# Patient Record
Sex: Male | Born: 1965 | Race: Black or African American | Hispanic: No | Marital: Married | Smoking: Never smoker
Health system: Southern US, Community
[De-identification: ages and names within clinical notes are randomized; demographics above are authoritative.]

## PROBLEM LIST (undated history)

## (undated) DIAGNOSIS — E785 Hyperlipidemia, unspecified: Secondary | ICD-10-CM

## (undated) HISTORY — PX: STRABISMUS SURGERY: SHX218

## (undated) HISTORY — PX: ELBOW SURGERY: SHX618

---

## 2013-11-23 LAB — LIPID PANEL
CHOLESTEROL: 188 mg/dL (ref 0–200)
HDL: 66 mg/dL (ref 35–70)
LDL Cholesterol: 110 mg/dL
Triglycerides: 60 mg/dL (ref 40–160)

## 2014-01-04 ENCOUNTER — Ambulatory Visit: Payer: Self-pay | Admitting: Family Medicine

## 2014-01-04 LAB — RAPID STREP-A WITH REFLX: Micro Text Report: NEGATIVE

## 2014-01-06 LAB — BETA STREP CULTURE(ARMC)

## 2014-04-09 ENCOUNTER — Ambulatory Visit: Payer: Self-pay | Admitting: Internal Medicine

## 2015-05-02 ENCOUNTER — Encounter: Payer: Self-pay | Admitting: Internal Medicine

## 2015-05-02 DIAGNOSIS — M19019 Primary osteoarthritis, unspecified shoulder: Secondary | ICD-10-CM | POA: Insufficient documentation

## 2015-05-02 DIAGNOSIS — E785 Hyperlipidemia, unspecified: Secondary | ICD-10-CM | POA: Insufficient documentation

## 2015-05-02 DIAGNOSIS — M25549 Pain in joints of unspecified hand: Secondary | ICD-10-CM | POA: Insufficient documentation

## 2015-05-02 DIAGNOSIS — R61 Generalized hyperhidrosis: Secondary | ICD-10-CM | POA: Insufficient documentation

## 2015-08-29 ENCOUNTER — Ambulatory Visit (INDEPENDENT_AMBULATORY_CARE_PROVIDER_SITE_OTHER): Payer: Self-pay | Admitting: Internal Medicine

## 2015-08-29 ENCOUNTER — Encounter: Payer: Self-pay | Admitting: Internal Medicine

## 2015-08-29 VITALS — BP 102/52 | HR 69 | Temp 98.2°F | Ht 71.0 in | Wt 143.6 lb

## 2015-08-29 DIAGNOSIS — J40 Bronchitis, not specified as acute or chronic: Secondary | ICD-10-CM

## 2015-08-29 MED ORDER — AMOXICILLIN-POT CLAVULANATE 875-125 MG PO TABS: 1.0000 | ORAL_TABLET | Freq: Two times a day (BID) | ORAL | Status: DC

## 2015-08-29 NOTE — Progress Notes (Signed)
    Date:  08/29/2015   Name:  Shaun Dodson   DOB:  01/19/1966   MRN:  [id number]   Chief Complaint: Cough Cough This is a new problem. The current episode started in the past 7 days. The problem has been unchanged. The problem occurs hourly. The cough is productive of sputum. Associated symptoms include chills and myalgias. Pertinent negatives include no ear pain, fever or nasal congestion. Shaun Dodson has tried OTC cough suppressant for the symptoms. The treatment provided mild relief.     Review of Systems  Constitutional: Positive for chills. Negative for fever.  HENT: Negative for ear pain.   Respiratory: Positive for cough.   Musculoskeletal: Positive for myalgias.    Patient Active Problem List   Diagnosis Date Noted  . Hyperlipidemia, mild 05/02/2015  . 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 Arthralgia of hand 05/02/2015    Prior to Admission medications   Not on File    Allergies  Allergen Reactions  . Compazine [Prochlorperazine Edisylate]     Past Surgical History  Procedure Laterality Date  . Strabismus surgery    . Elbow surgery      Social History  Substance Use Topics  . Smoking status: Never Smoker   . Smokeless tobacco: None  . Alcohol Use: No     Medication list has been reviewed and updated.   Physical Exam  Constitutional: Shaun Dodson appears well-developed and well-nourished.  HENT:  Right Ear: Tympanic membrane and ear canal normal.  Left Ear: Tympanic membrane and ear canal normal.  Nose: Right sinus exhibits no maxillary sinus tenderness and no frontal sinus tenderness. Left sinus exhibits no maxillary sinus tenderness and no frontal sinus tenderness.  Mouth/Throat: Uvula is midline and oropharynx is clear and moist.  Neck: Normal range of motion.  Cardiovascular: Normal rate, regular rhythm and normal heart sounds.   Pulmonary/Chest: Effort normal. No respiratory distress. Shaun Dodson has decreased breath sounds. Shaun Dodson  has no wheezes.  Musculoskeletal: Shaun Dodson exhibits no edema.  Lymphadenopathy:    Shaun Dodson has no cervical adenopathy.  Neurological: Shaun Dodson is alert.  Nursing note and vitals reviewed.   BP 102/52 mmHg  Pulse 69  Temp(Src) 98.2 F (36.8 C)  Ht 5\' 11"  (1.803 m)  Wt 143 lb 9.6 oz (65.137 kg)  BMI 20.04 kg/m2  SpO2 98%  Assessment and Plan: 1. Bronchitis Continue Dayquil as needed Increase fluid intake Out of work today - amoxicillin-clavulanate (AUGMENTIN) 875-125 MG tablet; Take 1 tablet by mouth 2 (two) times daily.  Dispense: 20 tablet; Refill: 0   Bari EdwardLaura Jaquann Guarisco, MD [facility]  08/29/2015

## 2015-12-06 ENCOUNTER — Encounter: Payer: Self-pay | Admitting: *Deleted

## 2015-12-06 ENCOUNTER — Ambulatory Visit
Admission: EM | Admit: 2015-12-06 | Discharge: 2015-12-06 | Disposition: A | Discharge status: Home / Self Care | Payer: BLUE CROSS/BLUE SHIELD | Attending: Family Medicine | Admitting: Family Medicine

## 2015-12-06 ENCOUNTER — Ambulatory Visit (INDEPENDENT_AMBULATORY_CARE_PROVIDER_SITE_OTHER): Payer: BLUE CROSS/BLUE SHIELD

## 2015-12-06 DIAGNOSIS — S61210A Laceration without foreign body of right index finger without damage to nail, initial encounter: Secondary | ICD-10-CM | POA: Diagnosis not present

## 2015-12-06 MED ORDER — TETANUS-DIPHTH-ACELL PERTUSSIS 5-2.5-18.5 LF-MCG/0.5 IM SUSP
0.5000 mL | Freq: Once | INTRAMUSCULAR | Status: AC
  Administered 2015-12-06: 0.5 mL via INTRAMUSCULAR

## 2015-12-06 MED ORDER — CEPHALEXIN 500 MG PO CAPS: 500.0000 mg | ORAL_CAPSULE | Freq: Four times a day (QID) | ORAL | Status: DC

## 2015-12-06 MED ORDER — LIDOCAINE HCL (PF) 1 % IJ SOLN: 5.0000 mL | Freq: Once | INTRAMUSCULAR | Status: DC

## 2015-12-06 NOTE — ED Provider Notes (Signed)
[facility]  ____________________________________________  Time seen: Approximately 2:03 PM  I have reviewed the triage vital signs and the nursing notes.   HISTORY  Chief Complaint Extremity Laceration   HPI Shaun Dodson is a 50 y.o. male presents with wife at bedside for the complaints of right second finger laceration post injury just prior to arrival. Patient reports that he was at home and working outside. Patient reports that he was using a tree pruning handsaw and accidentally cut his right second finger. Patient states that the saw slid downwards hitting his right second finger. Patient reports mild pain at the area. Reports full range of motion. Denies concerns of foreign bodies. Last tetanus shot was greater than 10 years ago. Denies any other pain or injury. Denies fall, head injury, loss of consciousness. Denies decreased range of motion to right second finger. Denies any other pain or injury.  History reviewed. No pertinent past medical history.  Patient Active Problem List   Diagnosis Date Noted  . Hyperlipidemia, mild 05/02/2015  . 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 Arthralgia of hand 05/02/2015    Past Surgical History  Procedure Laterality Date  . Strabismus surgery    . Elbow surgery      Current Outpatient Rx  Name  Route  Sig  Dispense  Refill  .           Marland Kitchen.             Allergies Compazine  Family History  Problem Relation Age of Onset  . Diabetes Mother   . Hyperlipidemia Mother     Social History Social History  Substance Use Topics  . Smoking status: Never Smoker   . Smokeless tobacco: Never Used  . Alcohol Use: No    Review of Systems Constitutional: No fever/chills Eyes: No visual changes. ENT: No sore throat. Cardiovascular: Denies chest pain. Respiratory: Denies shortness of breath. Gastrointestinal: No abdominal pain.  No nausea, no vomiting.  No diarrhea.  No  constipation. Genitourinary: Negative for dysuria. Musculoskeletal: Negative for back pain. Skin: Negative for rash.As above. Neurological: Negative for headaches, focal weakness or numbness.  10-point ROS otherwise negative.  ____________________________________________   PHYSICAL EXAM:  VITAL SIGNS: ED Triage Vitals  Enc Vitals Group     BP 12/06/15 1112 113/68 mmHg     Pulse Rate 12/06/15 1112 60     Resp 12/06/15 1112 18     Temp 12/06/15 1112 97.9 F (36.6 C)     Temp Source 12/06/15 1112 Oral     SpO2 12/06/15 1112 100 %     Weight 12/06/15 1112 145 lb (65.772 kg)     Height 12/06/15 1112 5\' 11"  (1.803 m)     Head Cir --      Peak Flow --      Pain Score 12/06/15 1118 7     Pain Loc --      Pain Edu? --      Excl. in GC? --     Constitutional: Alert and oriented. Well appearing and in no acute distress. Eyes: Conjunctivae are normal. PERRL. EOMI. Head: Atraumatic.  Ears: Normal external appearance bilaterally.  Nose: No congestion/rhinnorhea.  Mouth/Throat: Mucous membranes are moist.   Neck: No stridor.  No cervical spine tenderness to palpation. Cardiovascular: Normal rate, regular rhythm. Grossly normal heart sounds.  Good peripheral circulation. Respiratory: Normal respiratory effort.  No retractions. Lungs CTAB. No wheezes, rales or rhonchi. Gastrointestinal: Soft and nontender.  Musculoskeletal: No lower or upper extremity tenderness nor edema. See skin below. Neurologic:  Normal speech and language. No gross focal neurologic deficits are appreciated. No gait instability. Skin:  Skin is warm, dry and intact. No rash noted. Except: Right second distal phalanx pad 2.8 cm laceration with jagged irregular edges, no foreign bodies visualized, mild active bleeding, full range of motion to right second finger, right second finger tendon flexion and extension intact as well as sensation intact, no tendon visualized, right hand otherwise nontender and right hand skin  otherwise intact. Bilateral hand grips strong and equal. Psychiatric: Mood and affect are normal. Speech and behavior are normal. ____________________________________________   LABS (all labs ordered are listed, but only abnormal results are displayed)  Labs Reviewed - No data to display  RADIOLOGY  Dg Finger Index Right  12/06/2015  CLINICAL DATA:  Laceration while cutting tree with pain, initial encounter EXAM: RIGHT INDEX FINGER 2+V COMPARISON:  None. FINDINGS: Mild soft tissue irregularity is noted distally consistent with the given clinical history. No acute fracture or acute foreign body is noted. IMPRESSION: Soft tissue irregularity without acute bony abnormality. Electronically Signed   By: Alcide Clever M.D.   On: 12/06/2015 13:31   ____________________________________________   PROCEDURES  Procedure(s) performed: Procedure(s) performed:  Procedure explained and verbal consent obtained. Consent: Verbal consent obtained. Written consent not obtained. Risks and benefits: risks, benefits and alternatives were discussed Patient identity confirmed: verbally with patient and hospital-assigned identification number  Consent given by: patient   Laceration Repair Location:  right second finger. Length:  2.8 Foreign bodies: no foreign bodies Tendon involvement: none Nerve involvement: none Preparation: Patient was prepped and draped in the usual sterile fashion. Anesthesia with 1% Lidocaine 6 mls Irrigation solution: saline and Betadine  Irrigation method: jet lavage Wound margins revised  Amount of cleaning: copious Repaired with 5-0 nylon Number of sutures:  5 Technique: simple interrupted  Approximation: loose Patient tolerate well. Wound well approximated post repair.  Antibiotic ointment and dressing applied.  Wound care instructions provided.  Observe for any signs of infection or other problems.     ____________________________________________   INITIAL IMPRESSION  / ASSESSMENT AND PLAN / ED COURSE  Pertinent labs & imaging results that were available during my care of the patient were reviewed by me and considered in my medical decision making (see chart for details).  Well-appearing patient. No acute distress. Presents for the Right distal finger laceration post mechanical injury prior to arrival. Denies any other pain or injury. X-ray soft tissue swelling irregularity without acute bony abnormality per radiologist of right second finger. Laceration repaired. Patient tolerated well. As with dirty object from yard tool, will treat with oral cephalexin prophylactically. Discussed cleaning keeping clean and dry. Tetanus and immunization updated. Return to urgent care in 7-10 days for suture removal. Discussed return sooner for any redness, drainage, swelling, pain, decreased range of motion, new or worsening concerns.  Discussed follow up with Primary care physician this week. Discussed follow up and return parameters including no resolution or any worsening concerns. Patient verbalized understanding and agreed to plan.   ____________________________________________   FINAL CLINICAL IMPRESSION(S) / ED DIAGNOSES  Final diagnoses:  Laceration of right index finger w/o foreign body w/o damage to nail, initial encounter     Discharge Medication List as of 12/06/2015  1:53 PM    START taking these medications   Details  cephALEXin (KEFLEX) 500 MG capsule Take 1 capsule (500 mg total) by mouth 4 (four)  times daily., Starting 12/06/2015, Until Discontinued, Normal        Note: This dictation was prepared with Dragon dictation along with smaller phrase technology. Any transcriptional errors that result from this process are unintentional.       Renford DillsLindsey Kaylia Winborne, NP 12/06/15 1445

## 2015-12-06 NOTE — Discharge Instructions (Signed)
Take medication as prescribed. Keep clean and dry. Apply topical antibiotic daily.   Return to urgent care in 7-10 days for suture removal.   Follow up with your primary care physician this week as needed. Return to Urgent care for redness, drainage, swelling, pain, new or worsening concerns.    Laceration Care, Adult A laceration is a cut that goes through all of the layers of the skin and into the tissue that is right under the skin. Some lacerations heal on their own. Others need to be closed with stitches (sutures), staples, skin adhesive strips, or skin glue. Proper laceration care minimizes the risk of infection and helps the laceration to heal better. HOW TO CARE FOR YOUR LACERATION If sutures or staples were used:  Keep the wound clean and dry.  If you were given a bandage (dressing), you should change it at least one time per day or as told by your health care provider. You should also change it if it becomes wet or dirty.  Keep the wound completely dry for the first 24 hours or as told by your health care provider. After that time, you may shower or bathe. However, make sure that the wound is not soaked in water until after the sutures or staples have been removed.  Clean the wound one time each day or as told by your health care provider:  Wash the wound with soap and water.  Rinse the wound with water to remove all soap.  Pat the wound dry with a clean towel. Do not rub the wound.  After cleaning the wound, apply a thin layer of antibiotic ointmentas told by your health care provider. This will help to prevent infection and keep the dressing from sticking to the wound.  Have the sutures or staples removed as told by your health care provider. If skin adhesive strips were used:  Keep the wound clean and dry.  If you were given a bandage (dressing), you should change it at least one time per day or as told by your health care provider. You should also change it if it  becomes dirty or wet.  Do not get the skin adhesive strips wet. You may shower or bathe, but be careful to keep the wound dry.  If the wound gets wet, pat it dry with a clean towel. Do not rub the wound.  Skin adhesive strips fall off on their own. You may trim the strips as the wound heals. Do not remove skin adhesive strips that are still stuck to the wound. They will fall off in time. If skin glue was used:  Try to keep the wound dry, but you may briefly wet it in the shower or bath. Do not soak the wound in water, such as by swimming.  After you have showered or bathed, gently pat the wound dry with a clean towel. Do not rub the wound.  Do not do any activities that will make you sweat heavily until the skin glue has fallen off on its own.  Do not apply liquid, cream, or ointment medicine to the wound while the skin glue is in place. Using those may loosen the film before the wound has healed.  If you were given a bandage (dressing), you should change it at least one time per day or as told by your health care provider. You should also change it if it becomes dirty or wet.  If a dressing is placed over the wound, be careful not to  apply tape directly over the skin glue. Doing that may cause the glue to be pulled off before the wound has healed.  Do not pick at the glue. The skin glue usually remains in place for 5-10 days, then it falls off of the skin. General Instructions  Take over-the-counter and prescription medicines only as told by your health care provider.  If you were prescribed an antibiotic medicine or ointment, take or apply it as told by your doctor. Do not stop using it even if your condition improves.  To help prevent scarring, make sure to cover your wound with sunscreen whenever you are outside after stitches are removed, after adhesive strips are removed, or when glue remains in place and the wound is healed. Make sure to wear a sunscreen of at least 30 SPF.  Do  not scratch or pick at the wound.  Keep all follow-up visits as told by your health care provider. This is important.  Check your wound every day for signs of infection. Watch for:  Redness, swelling, or pain.  Fluid, blood, or pus.  Raise (elevate) the injured area above the level of your heart while you are sitting or lying down, if possible. SEEK MEDICAL CARE IF:  You received a tetanus shot and you have swelling, severe pain, redness, or bleeding at the injection site.  You have a fever.  A wound that was closed breaks open.  You notice a bad smell coming from your wound or your dressing.  You notice something coming out of the wound, such as wood or glass.  Your pain is not controlled with medicine.  You have increased redness, swelling, or pain at the site of your wound.  You have fluid, blood, or pus coming from your wound.  You notice a change in the color of your skin near your wound.  You need to change the dressing frequently due to fluid, blood, or pus draining from the wound.  You develop a new rash.  You develop numbness around the wound. SEEK IMMEDIATE MEDICAL CARE IF:  You develop severe swelling around the wound.  Your pain suddenly increases and is severe.  You develop painful lumps near the wound or on skin that is anywhere on your body.  You have a red streak going away from your wound.  The wound is on your hand or foot and you cannot properly move a finger or toe.  The wound is on your hand or foot and you notice that your fingers or toes look pale or bluish.   This information is not intended to replace advice given to you by your health care provider. Make sure you discuss any questions you have with your health care provider.   Document Released: 05/24/2005 Document Revised: 10/08/2014 Document Reviewed: 05/20/2014 Elsevier Interactive Patient Education [employer]

## 2015-12-06 NOTE — ED Notes (Signed)
Patient lacerated his right index finger with a tree pruning hand saw this am. Patient was not wearing gloves.

## 2016-03-04 ENCOUNTER — Encounter: Payer: Self-pay | Admitting: *Deleted

## 2016-03-04 ENCOUNTER — Ambulatory Visit
Admission: EM | Admit: 2016-03-04 | Discharge: 2016-03-04 | Disposition: A | Discharge status: Home / Self Care | Payer: BLUE CROSS/BLUE SHIELD | Attending: Family Medicine | Admitting: Family Medicine

## 2016-03-04 DIAGNOSIS — R197 Diarrhea, unspecified: Secondary | ICD-10-CM | POA: Diagnosis not present

## 2016-03-04 LAB — C DIFFICILE QUICK SCREEN W PCR REFLEX
C Diff antigen: NEGATIVE
C Diff interpretation: NOT DETECTED
C Diff toxin: NEGATIVE

## 2016-03-04 LAB — COMPREHENSIVE METABOLIC PANEL
ALT: 17 U/L (ref 17–63)
AST: 24 U/L (ref 15–41)
Albumin: 4.9 g/dL (ref 3.5–5.0)
Alkaline Phosphatase: 59 U/L (ref 38–126)
Anion gap: 10 (ref 5–15)
BILIRUBIN TOTAL: 1.1 mg/dL (ref 0.3–1.2)
BUN: 21 mg/dL — AB (ref 6–20)
CHLORIDE: 99 mmol/L — AB (ref 101–111)
CO2: 29 mmol/L (ref 22–32)
CREATININE: 1.1 mg/dL (ref 0.61–1.24)
Calcium: 9.4 mg/dL (ref 8.9–10.3)
GFR calc Af Amer: >60 mL/min (ref >60)
Glucose, Bld: 84 mg/dL (ref 65–99)
Potassium: 3.9 mmol/L (ref 3.5–5.1)
Sodium: 138 mmol/L (ref 135–145)
TOTAL PROTEIN: 8.4 g/dL — AB (ref 6.5–8.1)

## 2016-03-04 LAB — CBC WITH DIFFERENTIAL/PLATELET
BASOS ABS: 0 10*3/uL (ref 0–0.1)
Basophils Relative: 1 %
Eosinophils Absolute: 0.1 10*3/uL (ref 0–0.7)
Eosinophils Relative: 1 %
HEMATOCRIT: 48.9 % (ref 40.0–52.0)
HEMOGLOBIN: 17.1 g/dL (ref 13.0–18.0)
LYMPHS PCT: 18 %
Lymphs Abs: 1.4 10*3/uL (ref 1.0–3.6)
MCH: 30.2 pg (ref 26.0–34.0)
MCHC: 35 g/dL (ref 32.0–36.0)
MCV: 86.4 fL (ref 80.0–100.0)
Monocytes Absolute: 1.3 10*3/uL — ABNORMAL HIGH (ref 0.2–1.0)
Monocytes Relative: 16 %
NEUTROS ABS: 5 10*3/uL (ref 1.4–6.5)
Neutrophils Relative %: 64 %
Platelets: 182 10*3/uL (ref 150–440)
RBC: 5.66 MIL/uL (ref 4.40–5.90)
RDW: 13.5 % (ref 11.5–14.5)
WBC: 7.9 10*3/uL (ref 3.8–10.6)

## 2016-03-04 LAB — LIPASE, BLOOD: LIPASE: 26 U/L (ref 11–51)

## 2016-03-04 MED ORDER — CIPROFLOXACIN HCL 500 MG PO TABS: 500.0000 mg | ORAL_TABLET | Freq: Two times a day (BID) | ORAL | 0 refills | Status: AC

## 2016-03-04 NOTE — Discharge Instructions (Signed)
Take medication as prescribed. Rest. Drink plenty of fluids. Follow BRAT diet. ° °Follow up with your primary care physician this week as needed. Return to Urgent care for new or worsening concerns.  ° °

## 2016-03-04 NOTE — ED Triage Notes (Signed)
Onset of diarrhea last Monday, has been persistent since. This morning at 0430, onset of right lower leg cramping. [place] traveled to the [place] last [place].

## 2016-03-04 NOTE — ED Provider Notes (Signed)
[facility] ____________________________________________  Time seen: Approximately 1:04 PM  I have reviewed the triage vital signs and the nursing notes.   HISTORY  Chief Complaint Diarrhea  HPI Shaun Dodson is a 50 y.o. male presents for complaint of diarrhea. Patient reports diarrhea has been occurring since Monday. Patient reports that this past week from Wednesday to Monday he was in the [place] for [place]. [place], and then ate at [employer] Monday evening and reports [facility] a [facility] he began having diarrhea. Patient reports multiple diarrheal episodes between Monday to Tuesday. Patient reports later tissues of the diarrhea started to improve and the improvement continued into Wednesday day but reports the diarrhea returned Wednesday evening. Reports 4-5 episodes of diarrhea last night into this morning.  Patient reports this is been unresolved with drinking more fluids and Pepto-Bismol. Patient reports that he did have one episode right after taking the Pepto-Bismol that his tongue looked black but denies any since. Patient also reports after taking the Pepto-Bismol yet one episode of bowel movement looking darker in color but denies any blood in toilet or blood in stool. Denies any other changes in stool color. Patient reports he's had some abdominal "queasiness "but declines abdominal pain.  Denies nausea, vomiting, back pain, dysuria, penile or testicular complaints, rash. Denies known trigger. Patient states that the drink bottled water while traveling, the patient reports while he is on vacation he did consume more alcohol than normal. Patient states that they had a private chef while he is on vacation and declines having any abnormal foods or appearance of his or uncooked. Patient states that he initially thought his symptoms onset was from [facility] due to the  timing.  Denies recent sickness. Denies chest pain, shortness of breath, dizziness or weakness. Patient reports continues to drink fluids well. Denies any urinary changes. States last diarrhea was at approximate 6 and this morning. Denies others around him with similar symptoms.   Bari EdwardLaura Berglund, MD PCP   History reviewed. No pertinent past medical history.  Patient Active Problem List   Diagnosis Date Noted  . Hyperlipidemia, mild 05/02/2015  . 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 Arthralgia of hand 05/02/2015    Past Surgical History:  Procedure Laterality Date  . ELBOW SURGERY    . STRABISMUS SURGERY      Current Outpatient Rx  . Order #: [id number] Class: Normal  . Order #: [id number] Class: Normal    No current facility-administered medications for this encounter.   Current Outpatient Prescriptions:  .  cephALEXin (KEFLEX) 500 MG capsule, Take 1 capsule (500 mg total) by mouth 4 (four) times daily., Disp: 20 capsule, Rfl: 0 .  ciprofloxacin (CIPRO) 500 MG tablet, Take 1 tablet (500 mg total) by mouth 2 (two) times daily., Disp: 10 tablet, Rfl: 0  Allergies Compazine [prochlorperazine edisylate]  Family History  Problem Relation Age of Onset  . Diabetes Mother   . Hyperlipidemia Mother     Social History Social History  Substance Use Topics  . Smoking status: Never Smoker  . Smokeless tobacco: Never Used  . Alcohol use 0.0 oz/week    Review of Systems Constitutional: No fever/chills Eyes: No visual changes. ENT: No sore throat. Cardiovascular: Denies chest pain. Respiratory: Denies shortness of breath. Gastrointestinal: No abdominal pain.  No nausea, no vomiting.  No diarrhea.  No constipation. Genitourinary: Negative for dysuria. Musculoskeletal: Negative  for back pain. Skin: Negative for rash. Neurological: Negative for headaches, focal weakness or numbness.  10-point ROS otherwise  negative.  ____________________________________________   PHYSICAL EXAM:  VITAL SIGNS: ED Triage Vitals  Enc Vitals Group     BP --      Pulse --      Resp 03/04/16 1233 16     Temp 03/04/16 1233 98.1 F (36.7 C)     Temp Source 03/04/16 1233 Oral     SpO2 03/04/16 1233 100 %     Weight 03/04/16 1238 145 lb (65.8 kg)     Height 03/04/16 1238 5\' 11"  (1.803 m)     Head Circumference --      Peak Flow --      Pain Score --      Pain Loc --      Pain Edu? --      Excl. in GC? --    Today's Vitals   03/04/16 1233 03/04/16 1238 03/04/16 1433  Resp: 16    Temp: 98.1 F (36.7 C)    TempSrc: Oral    SpO2: 100%    Weight:  145 lb (65.8 kg)   Height:  5\' 11"  (1.803 m)   PainSc:   0-No pain   Orthostatic VS for the past 24 hrs:  BP- Lying Pulse- Lying BP- Sitting Pulse- Sitting BP- Standing at 0 minutes Pulse- Standing at 0 minutes  03/04/16 1235 114/76 60 111/75 79 120/77 95       Constitutional: Alert and oriented. Well appearing and in no acute distress. Eyes: Conjunctivae are normal. PERRL. EOMI. ENT      Head: Normocephalic and atraumatic.      Mouth/Throat: Mucous membranes are moist.Oropharynx non-erythematous. Normal coloration.  Neck: No stridor. Supple without meningismus.  Hematological/Lymphatic/Immunilogical: No cervical lymphadenopathy. Cardiovascular: Normal rate, regular rhythm. Grossly normal heart sounds.  Good peripheral circulation. Respiratory: Normal respiratory effort without tachypnea nor retractions. Breath sounds are clear and equal bilaterally. No wheezes/rales/rhonchi.. Gastrointestinal: Minimal epigastric tenderness. Abdomen otherwise soft and nontender.  No distention. Normal Bowel sounds. No CVA tenderness. Rectal: Patient declined rectal exam. Musculoskeletal:  Nontender with normal range of motion in all extremities. No midline cervical, thoracic or lumbar tenderness to palpation. Neurologic:  Normal speech and language. No gross focal  neurologic deficits are appreciated. Speech is normal. No gait instability.  Skin:  Skin is warm, dry and intact. No rash noted. Psychiatric: Mood and affect are normal. Speech and behavior are normal. Patient exhibits appropriate insight and judgment   ___________________________________________   LABS (all labs ordered are listed, but only abnormal results are displayed)  Labs Reviewed  CBC WITH DIFFERENTIAL/PLATELET - Abnormal; Notable for the following:       Result Value   Monocytes Absolute 1.3 (*)    All other components within normal limits  COMPREHENSIVE METABOLIC PANEL - Abnormal; Notable for the following:    Chloride 99 (*)    BUN 21 (*)    Total Protein 8.4 (*)    All other components within normal limits  GASTROINTESTINAL PANEL BY PCR, STOOL (REPLACES STOOL CULTURE)  C DIFFICILE QUICK SCREEN W PCR REFLEX  LIPASE, BLOOD     PROCEDURES Procedures    INITIAL IMPRESSION / ASSESSMENT AND PLAN / ED COURSE  Pertinent labs & imaging results that were available during my care of the patient were reviewed by me and considered in my medical decision making (see chart for details).  Very well-appearing patient. No acute distress. Presents  for the complaints of diarrhea. Patient has had recent travel to the [place] just prior to symptom onset. Reports drinking bottled water and declines abnormal foods. Denies obvious blood in stool. Patient does report he had one episode of having darker stool 2 days ago as well as the same timeframe that he had a darker colored tongue, suspect that this is related around the Pepto-Bismol taking may have been from Pepto-Bismol. CBC, CMP and lipase reviewed. Discussed results with patient. Recommended to patient to obtain a GI panel and C. difficile by stool tests, however patient was unable to provide sample in urgent care. Lables sent with patient to bring sample back today. As concern for possible infectious process or traveler's  diarrhea, will treat patient with oral Cipro. Encouraged supportive care with over-the-counter medications as well as fluids and BRAT diet. Discussed follow-up and return parameters with patient.Discussed indication, risks and benefits of medications with patient.  Discussed follow up with Primary care physician this week. Discussed follow up and return parameters including no resolution or any worsening concerns. Patient verbalized understanding and agreed to plan.   ____________________________________________   FINAL CLINICAL IMPRESSION(S) / ED DIAGNOSES  Final diagnoses:  Diarrhea, unspecified type     Discharge Medication List as of 03/04/2016  2:29 PM    START taking these medications   Details  ciprofloxacin (CIPRO) 500 MG tablet Take 1 tablet (500 mg total) by mouth 2 (two) times daily., Starting Thu 03/04/2016, Until Tue 03/09/2016, Normal        Note: This dictation was prepared with Dragon dictation along with smaller phrase technology. Any transcriptional errors that result from this process are unintentional.    Clinical Course      Renford Dills, NP 03/04/16 1503    Renford Dills, NP 03/04/16 1504

## 2016-03-05 ENCOUNTER — Telehealth: Payer: Self-pay

## 2016-03-05 LAB — GASTROINTESTINAL PANEL BY PCR, STOOL (REPLACES STOOL CULTURE)
ADENOVIRUS F40/41: NOT DETECTED
ASTROVIRUS: NOT DETECTED
CAMPYLOBACTER SPECIES: DETECTED — AB
CRYPTOSPORIDIUM: NOT DETECTED
CYCLOSPORA CAYETANENSIS: NOT DETECTED
ENTEROAGGREGATIVE E COLI (EAEC): NOT DETECTED
ENTEROPATHOGENIC E COLI (EPEC): DETECTED — AB
ENTEROTOXIGENIC E COLI (ETEC): NOT DETECTED
Entamoeba histolytica: NOT DETECTED
GIARDIA LAMBLIA: NOT DETECTED
Norovirus GI/GII: NOT DETECTED
PLESIMONAS SHIGELLOIDES: NOT DETECTED
ROTAVIRUS A: NOT DETECTED
Salmonella species: NOT DETECTED
Sapovirus (I, II, IV, and V): NOT DETECTED
Shiga like toxin producing E coli (STEC): NOT DETECTED
Shigella/Enteroinvasive E coli (EIEC): NOT DETECTED
VIBRIO SPECIES: NOT DETECTED
Vibrio cholerae: NOT DETECTED
YERSINIA ENTEROCOLITICA: NOT DETECTED

## 2016-03-05 NOTE — Telephone Encounter (Signed)
Patient was informed of lab results from recent visit. Patient was informed that if symptoms persist or worsen to f/u at the ED.

## 2016-10-03 IMAGING — CR DG FINGER INDEX 2+V*R*
3 series · 3 of 3 positions shown · non-contrast
Comparison: None.

CLINICAL DATA: Laceration while cutting tree with pain, initial
encounter

EXAM:
RIGHT INDEX FINGER 2+V

[finger ap]
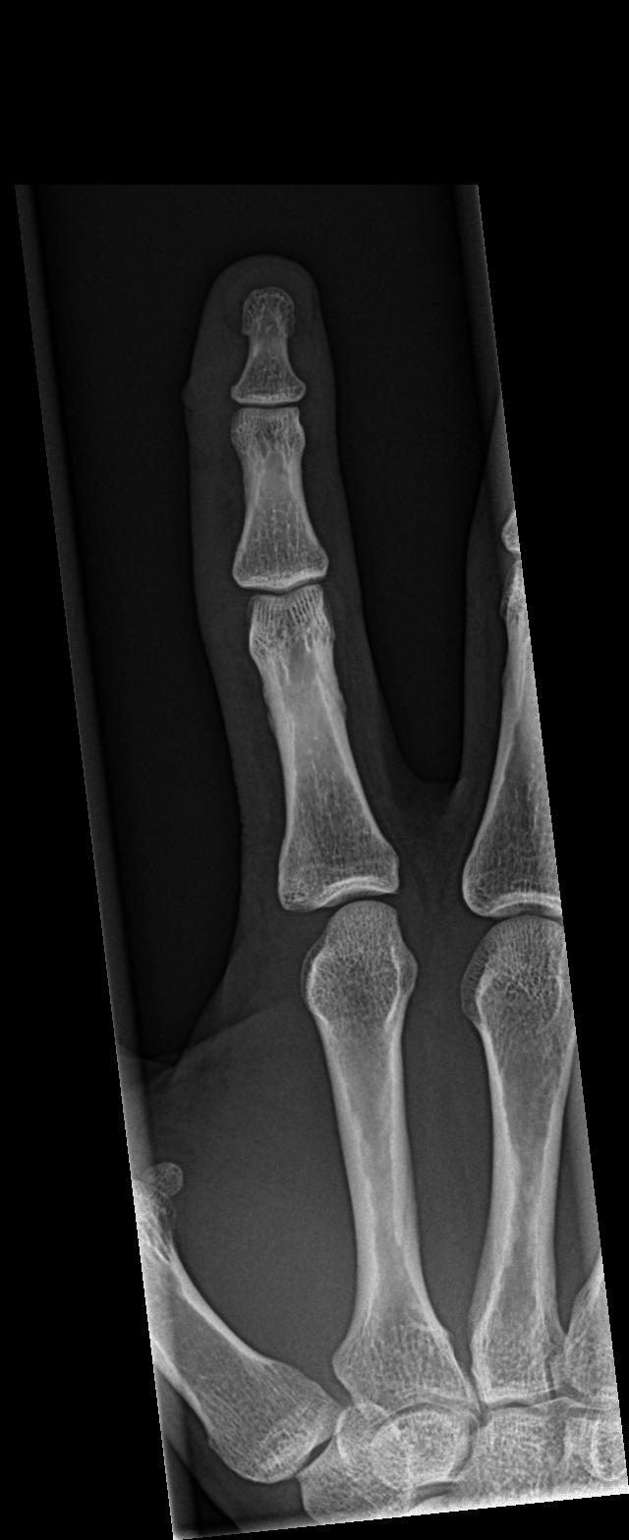

[finger obl]
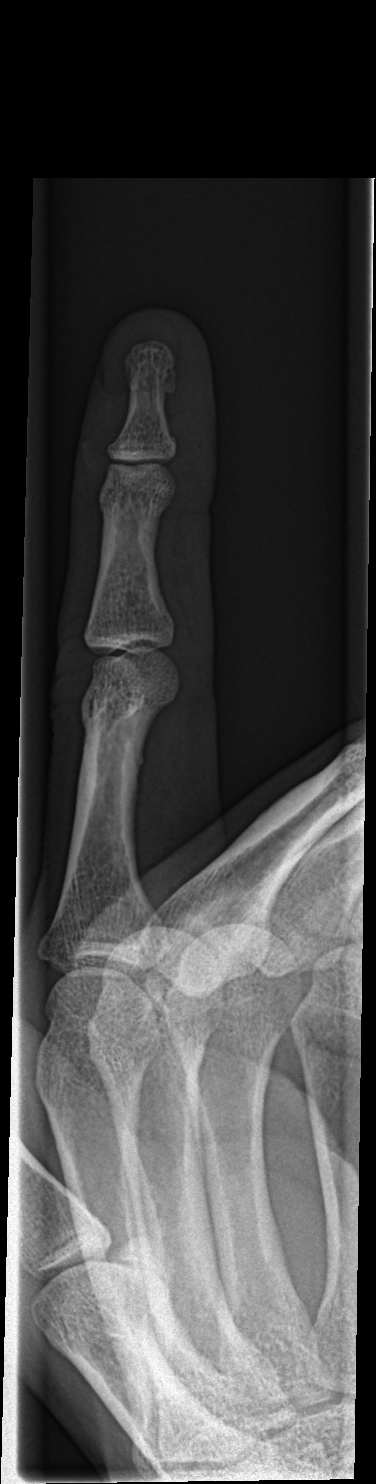

[finger lat]
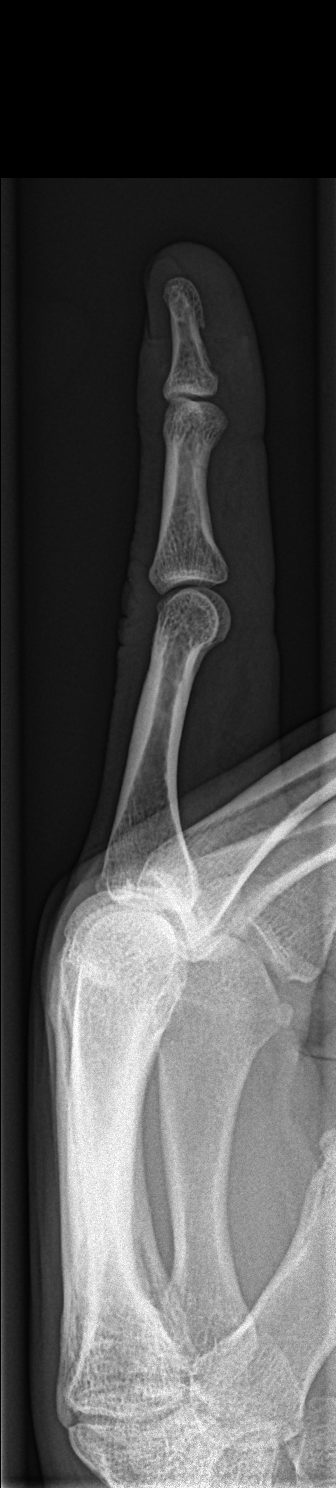

[3 of 3 positions shown; findings below may reference images not displayed]

FINDINGS: Mild soft tissue irregularity is noted distally consistent with the
given clinical history. No acute fracture or acute foreign body is
noted.
IMPRESSION: Soft tissue irregularity without acute bony abnormality.

## 2016-11-10 ENCOUNTER — Ambulatory Visit: Payer: Self-pay | Admitting: Internal Medicine

## 2017-03-29 ENCOUNTER — Ambulatory Visit (INDEPENDENT_AMBULATORY_CARE_PROVIDER_SITE_OTHER): Payer: BLUE CROSS/BLUE SHIELD | Admitting: Internal Medicine

## 2017-03-29 ENCOUNTER — Encounter: Payer: Self-pay | Admitting: Internal Medicine

## 2017-03-29 VITALS — BP 94/68 | HR 62 | Ht 71.0 in | Wt 151.0 lb

## 2017-03-29 DIAGNOSIS — E782 Mixed hyperlipidemia: Secondary | ICD-10-CM | POA: Diagnosis not present

## 2017-03-29 NOTE — Progress Notes (Signed)
    Date:  03/29/2017   Name:  Tamera ReasonKenneth Nelson Dario   DOB:  [birth year]   MRN:  [id number]   Chief Complaint: No chief complaint on file. Hyperlipidemia  This is a chronic problem. Pertinent negatives include no chest pain or shortness of breath. He is currently on no antihyperlipidemic treatment (has taken zocor in the past).   He reports taking Zocor for several years about 8 years ago. He did participate in a study in [place] in which he received an injection of cholesterol lowering medication one time. For several years after that his cholesterol was excellent. He is concerned though that it may have increased again.   Review of Systems  Constitutional: Negative for chills, fatigue and fever.  Eyes: Negative for visual disturbance.  Respiratory: Negative for cough, chest tightness, shortness of breath and wheezing.   Cardiovascular: Negative for chest pain and palpitations.  Gastrointestinal: Negative for abdominal pain, nausea and vomiting.  Musculoskeletal: Negative for arthralgias.  Skin: Negative for rash.  Neurological: Negative for dizziness and headaches.  Hematological: Negative for adenopathy.  Psychiatric/Behavioral: Negative for dysphoric mood and sleep disturbance.    Patient Active Problem List   Diagnosis Date Noted  . Hyperlipidemia, mild 05/02/2015  . 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 Arthralgia of hand 05/02/2015    Prior to Admission medications   Not on File    Allergies  Allergen Reactions  . Compazine [Prochlorperazine Edisylate]     Past Surgical History:  Procedure Laterality Date  . ELBOW SURGERY    . STRABISMUS SURGERY      Social History  Substance Use Topics  . Smoking status: Never Smoker  . Smokeless tobacco: Never Used  . Alcohol use 0.0 oz/week     Medication list has been reviewed and updated.  PHQ 2/9 Scores 03/29/2017  PHQ - 2 Score 0    Physical Exam  Constitutional: He is  oriented to person, place, and time. He appears well-developed. No distress.  HENT:  Head: Normocephalic and atraumatic.  Neck: Carotid bruit is not present.  Cardiovascular: Normal rate, regular rhythm and normal heart sounds.   Pulmonary/Chest: Effort normal and breath sounds normal. No respiratory distress.  Musculoskeletal: Normal range of motion.  Neurological: He is alert and oriented to person, place, and time.  Skin: Skin is warm and dry. No rash noted.  Psychiatric: He has a normal mood and affect. His speech is normal and behavior is normal. Thought content normal.  Nursing note and vitals reviewed.   BP 94/68   Pulse 62   Ht 5\' 11"  (1.803 m)   Wt 151 lb (68.5 kg)   SpO2 100%   BMI 21.06 kg/m   Assessment and Plan: 1. Mixed hyperlipidemia Check labs and advise - Comprehensive metabolic panel - Lipid panel   No orders of the defined types were placed in this encounter.   Partially dictated using Animal nutrition[employer]Dragon software. Any errors are unintentional.  Bari EdwardLaura Branden Shallenberger, MD [facility]  03/29/2017

## 2017-03-30 LAB — LIPID PANEL
Chol/HDL Ratio: 3.7 ratio (ref 0.0–5.0)
Cholesterol, Total: 228 mg/dL — ABNORMAL HIGH (ref 100–199)
HDL: 61 mg/dL (ref >39)
LDL Calculated: 148 mg/dL — ABNORMAL HIGH (ref 0–99)
TRIGLYCERIDES: 95 mg/dL (ref 0–149)
VLDL Cholesterol Cal: 19 mg/dL (ref 5–40)

## 2017-03-30 LAB — COMPREHENSIVE METABOLIC PANEL
A/G RATIO: 1.9 (ref 1.2–2.2)
ALK PHOS: 66 IU/L (ref 39–117)
ALT: 19 IU/L (ref 0–44)
AST: 20 IU/L (ref 0–40)
Albumin: 4.8 g/dL (ref 3.5–5.5)
BUN/Creatinine Ratio: 15 (ref 9–20)
BUN: 14 mg/dL (ref 6–24)
Bilirubin Total: 0.7 mg/dL (ref 0.0–1.2)
CALCIUM: 9.7 mg/dL (ref 8.7–10.2)
CO2: 27 mmol/L (ref 20–29)
Chloride: 99 mmol/L (ref 96–106)
Creatinine, Ser: 0.93 mg/dL (ref 0.76–1.27)
GFR calc Af Amer: 111 mL/min/{1.73_m2} (ref >59)
GFR, EST NON AFRICAN AMERICAN: 96 mL/min/{1.73_m2} (ref >59)
GLOBULIN, TOTAL: 2.5 g/dL (ref 1.5–4.5)
Glucose: 80 mg/dL (ref 65–99)
POTASSIUM: 4.9 mmol/L (ref 3.5–5.2)
SODIUM: 141 mmol/L (ref 134–144)
Total Protein: 7.3 g/dL (ref 6.0–8.5)

## 2017-04-20 ENCOUNTER — Other Ambulatory Visit: Payer: Self-pay

## 2017-04-20 ENCOUNTER — Encounter: Payer: Self-pay | Admitting: *Deleted

## 2017-04-20 ENCOUNTER — Ambulatory Visit
Admission: EM | Admit: 2017-04-20 | Discharge: 2017-04-20 | Disposition: A | Discharge status: Home / Self Care | Payer: BLUE CROSS/BLUE SHIELD | Attending: Family Medicine | Admitting: Family Medicine

## 2017-04-20 DIAGNOSIS — R112 Nausea with vomiting, unspecified: Secondary | ICD-10-CM | POA: Diagnosis not present

## 2017-04-20 LAB — COMPREHENSIVE METABOLIC PANEL
ALK PHOS: 58 U/L (ref 38–126)
ALT: 21 U/L (ref 17–63)
AST: 26 U/L (ref 15–41)
Albumin: 4.3 g/dL (ref 3.5–5.0)
Anion gap: 10 (ref 5–15)
BILIRUBIN TOTAL: 1.3 mg/dL — AB (ref 0.3–1.2)
BUN: 14 mg/dL (ref 6–20)
CALCIUM: 8.6 mg/dL — AB (ref 8.9–10.3)
CO2: 28 mmol/L (ref 22–32)
CREATININE: 0.94 mg/dL (ref 0.61–1.24)
Chloride: 99 mmol/L — ABNORMAL LOW (ref 101–111)
GFR calc non Af Amer: >60 mL/min (ref >60)
Glucose, Bld: 107 mg/dL — ABNORMAL HIGH (ref 65–99)
Potassium: 3.6 mmol/L (ref 3.5–5.1)
SODIUM: 137 mmol/L (ref 135–145)
TOTAL PROTEIN: 7.1 g/dL (ref 6.5–8.1)

## 2017-04-20 MED ORDER — ONDANSETRON 8 MG PO TBDP: 8.0000 mg | ORAL_TABLET | Freq: Three times a day (TID) | ORAL | 0 refills | Status: DC | PRN

## 2017-04-20 NOTE — ED Triage Notes (Signed)
Patient started having symptoms of nausea and vomiting last PM after eating at [employer]. Patient reports that today his nausea has decreased and has been able to drink water and eat chicken noodle soup.

## 2017-04-20 NOTE — ED Provider Notes (Signed)
[facility]    CSN: [id number] Arrival date & time: 04/20/17  1504     History   Chief Complaint Chief Complaint  Patient presents with  . Nausea  . Emesis    HPI Tamera ReasonKenneth Nelson Mathey is a 51 y.o. male.   51 yo male with a c/o nausea and vomiting since last night, hours after eating a sandwich from [employer]. Has vomited at least 5 times and last episode was around 1am. States that today his nausea has subsided and he's been able to drink some water and some Pedialyte. Denies any diarrhea, fevers, chills, hematemesis.    The history is provided by the patient.  Emesis    History reviewed. No pertinent past medical history.  Patient Active Problem List   Diagnosis Date Noted  . Hyperlipidemia, mild 05/02/2015  . 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 Arthralgia of hand 05/02/2015    Past Surgical History:  Procedure Laterality Date  . ELBOW SURGERY    . STRABISMUS SURGERY         Home Medications    Prior to Admission medications   Medication Sig Start Date End Date Taking? Authorizing Provider  ondansetron (ZOFRAN ODT) 8 MG disintegrating tablet Take 1 tablet (8 mg total) every 8 (eight) hours as needed by mouth. 04/20/17   Payton Mccallumonty, Mitch Arquette, MD    Family History Family History  Problem Relation Age of Onset  . Diabetes Mother   . Hyperlipidemia Mother     Social History Social History   Tobacco Use  . Smoking status: Never Smoker  . Smokeless tobacco: Never Used  Substance Use Topics  . Alcohol use: Yes    Alcohol/week: 1.2 oz    Types: 2 Standard drinks or equivalent per week  . Drug use: No     Allergies   Compazine [prochlorperazine edisylate]   Review of Systems Review of Systems  Gastrointestinal: Positive for vomiting.     Physical Exam Triage Vital Signs ED Triage Vitals  Enc Vitals Group     BP 04/20/17 1514 120/73     Pulse Rate 04/20/17 1514 65     Resp 04/20/17 1514 16   Temp 04/20/17 1514 97.8 F (36.6 C)     Temp Source 04/20/17 1514 Oral     SpO2 04/20/17 1514 100 %     Weight 04/20/17 1515 150 lb (68 kg)     Height 04/20/17 1515 5\' 11"  (1.803 m)     Head Circumference --      Peak Flow --      Pain Score 04/20/17 1516 0     Pain Loc --      Pain Edu? --      Excl. in GC? --    No data found.  Updated Vital Signs BP 120/73 (BP Location: Left Arm)   Pulse 65   Temp 97.8 F (36.6 C) (Oral)   Resp 16   Ht 5\' 11"  (1.803 m)   Wt 150 lb (68 kg)   SpO2 100%   BMI 20.92 kg/m   Visual Acuity Right Eye Distance:   Left Eye Distance:   Bilateral Distance:    Right Eye Near:   Left Eye Near:    Bilateral Near:     Physical Exam  Constitutional: He is oriented to person, place, and time. He appears well-developed and well-nourished. No distress.  HENT:  Head: Normocephalic and atraumatic.  Cardiovascular: Normal rate, regular rhythm, normal heart sounds and  intact distal pulses.  No murmur heard. Pulmonary/Chest: Effort normal and breath sounds normal. No respiratory distress. He has no wheezes. He has no rales.  Abdominal: Soft. Bowel sounds are normal. He exhibits no distension and no mass. There is tenderness (mild; diffuse; no rebound or guarding). There is no rebound and no guarding.  Neurological: He is alert and oriented to person, place, and time.  Skin: No rash noted. He is not diaphoretic.  Nursing note and vitals reviewed.    UC Treatments / Results  Labs (all labs ordered are listed, but only abnormal results are displayed) Labs Reviewed  COMPREHENSIVE METABOLIC PANEL - Abnormal; Notable for the following components:      Result Value   Chloride 99 (*)    Glucose, Bld 107 (*)    Calcium 8.6 (*)    Total Bilirubin 1.3 (*)    All other components within normal limits    EKG  EKG Interpretation None       Radiology No results found.  Procedures Procedures (including critical care time)  Medications Ordered in  UC Medications - No data to display   Initial Impression / Assessment and Plan / UC Course  I have reviewed the triage vital signs and the nursing notes.  Pertinent labs & imaging results that were available during my care of the patient were reviewed by me and considered in my medical decision making (see chart for details).       Final Clinical Impressions(s) / UC Diagnoses   Final diagnoses:  Non-intractable vomiting with nausea, unspecified vomiting type   (likely secondary to food poisoning)  ED Discharge Orders        Ordered    ondansetron (ZOFRAN ODT) 8 MG disintegrating tablet  Every 8 hours PRN     04/20/17 1625     1. Lab results and diagnosis reviewed with patient 2. rx as per orders above; reviewed possible side effects, interactions, risks and benefits  3. Recommend supportive treatment with increased fluids 4. Follow-up prn if symptoms worsen or don't improve  Controlled Substance Prescriptions [place] Controlled Substance Registry consulted? Not Applicable   Payton Mccallumonty, Kimberla Driskill, MD 04/20/17 [phone]

## 2017-07-17 ENCOUNTER — Ambulatory Visit
Admission: EM | Admit: 2017-07-17 | Discharge: 2017-07-17 | Disposition: A | Discharge status: Home / Self Care | Payer: BLUE CROSS/BLUE SHIELD | Attending: Family Medicine | Admitting: Family Medicine

## 2017-07-17 ENCOUNTER — Encounter: Payer: Self-pay | Admitting: Gynecology

## 2017-07-17 ENCOUNTER — Other Ambulatory Visit: Payer: Self-pay

## 2017-07-17 DIAGNOSIS — L03012 Cellulitis of left finger: Secondary | ICD-10-CM

## 2017-07-17 MED ORDER — SULFAMETHOXAZOLE-TRIMETHOPRIM 800-160 MG PO TABS: 1.0000 | ORAL_TABLET | Freq: Two times a day (BID) | ORAL | 0 refills | Status: DC

## 2017-07-17 NOTE — ED Triage Notes (Signed)
Patient c/o left ring finger nail infection x 6 days.

## 2017-07-17 NOTE — ED Provider Notes (Signed)
[facility]    CSN: [id number] Arrival date & time: 07/17/17  4098     History   Chief Complaint Chief Complaint  Patient presents with  . Nail Problem    HPI Shaun Dodson is a 52 y.o. male.   52 yo male with a c/o left ring finger redness, swelling and pain of the skin around the edge of the nail. Denies any fevers, chills, drainage. Denies biting his fingernails.    The history is provided by the patient.    History reviewed. No pertinent past medical history.  Patient Active Problem List   Diagnosis Date Noted  . Hyperlipidemia, mild 05/02/2015  . 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 Arthralgia of hand 05/02/2015    Past Surgical History:  Procedure Laterality Date  . ELBOW SURGERY    . STRABISMUS SURGERY         Home Medications    Prior to Admission medications   Medication Sig Start Date End Date Taking? Authorizing Provider  ondansetron (ZOFRAN ODT) 8 MG disintegrating tablet Take 1 tablet (8 mg total) every 8 (eight) hours as needed by mouth. 04/20/17   Payton Mccallum, MD  sulfamethoxazole-trimethoprim (BACTRIM DS,SEPTRA DS) 800-160 MG tablet Take 1 tablet by mouth 2 (two) times daily. 07/17/17   Payton Mccallum, MD    Family History Family History  Problem Relation Age of Onset  . Diabetes Mother   . Hyperlipidemia Mother     Social History Social History   Tobacco Use  . Smoking status: Never Smoker  . Smokeless tobacco: Never Used  Substance Use Topics  . Alcohol use: Yes    Alcohol/week: 1.2 oz    Types: 2 Standard drinks or equivalent per week  . Drug use: No     Allergies   Compazine [prochlorperazine edisylate]   Review of Systems Review of Systems   Physical Exam Triage Vital Signs ED Triage Vitals  Enc Vitals Group     BP 07/17/17 1019 124/73     Pulse Rate 07/17/17 1019 72     Resp 07/17/17 1019 16     Temp 07/17/17 1019 97.9 F (36.6 C)     Temp Source  07/17/17 1019 Oral     SpO2 07/17/17 1019 100 %     Weight 07/17/17 1017 145 lb (65.8 kg)     Height 07/17/17 1016 5\' 11"  (1.803 m)     Head Circumference --      Peak Flow --      Pain Score 07/17/17 1056 2     Pain Loc --      Pain Edu? --      Excl. in GC? --    No data found.  Updated Vital Signs BP 124/73 (BP Location: Left Arm)   Pulse 72   Temp 97.9 F (36.6 C) (Oral)   Resp 16   Ht 5\' 11"  (1.803 m)   Wt 145 lb (65.8 kg)   SpO2 100%   BMI 20.22 kg/m   Visual Acuity Right Eye Distance:   Left Eye Distance:   Bilateral Distance:    Right Eye Near:   Left Eye Near:    Bilateral Near:     Physical Exam  Constitutional: He appears well-developed and well-nourished. No distress.  Musculoskeletal:       Left hand: He exhibits tenderness (left ring finger skin at the edge of the nail) and swelling. He exhibits normal range of motion, no bony tenderness,  normal two-point discrimination, normal capillary refill, no deformity and no laceration. Normal sensation noted. Normal strength noted.  Skin: He is not diaphoretic.  Nursing note and vitals reviewed.    UC Treatments / Results  Labs (all labs ordered are listed, but only abnormal results are displayed) Labs Reviewed - No data to display  EKG  EKG Interpretation None       Radiology No results found.  Procedures Incision and Drainage Date/Time: 07/17/2017 12:25 PM Performed by: Payton Mccallumonty, Chesni Vos, MD Authorized by: Payton Mccallumonty, Vian Fluegel, MD   Consent:    Consent obtained:  Verbal   Consent given by:  Patient   Risks discussed:  Bleeding, incomplete drainage, pain and infection   Alternatives discussed:  No treatment Location:    Type:  Abscess (paronychia)   Location:  Upper extremity   Upper extremity location:  Finger   Finger location:  L ring finger Pre-procedure details:    Skin preparation:  Antiseptic wash Anesthesia (see MAR for exact dosages):    Anesthesia method:  None Procedure type:     Complexity:  Simple Procedure details:    Incision types:  Stab incision (puncture)   Incision depth:  Dermal   Scalpel blade:  11   Drainage:  Purulent   Drainage amount:  Moderate   Wound treatment:  Wound left open   Packing materials:  None Post-procedure details:    Patient tolerance of procedure:  Tolerated well, no immediate complications   (including critical care time)  Medications Ordered in UC Medications - No data to display   Initial Impression / Assessment and Plan / UC Course  I have reviewed the triage vital signs and the nursing notes.  Pertinent labs & imaging results that were available during my care of the patient were reviewed by me and considered in my medical decision making (see chart for details).       Final Clinical Impressions(s) / UC Diagnoses   Final diagnoses:  Paronychia of finger, left    ED Discharge Orders        Ordered    sulfamethoxazole-trimethoprim (BACTRIM DS,SEPTRA DS) 800-160 MG tablet  2 times daily     07/17/17 1053     1. diagnosis reviewed with patient 2. Procedure as per note above 3. rx as per orders above; reviewed possible side effects, interactions, risks and benefits  4. Recommend supportive treatment with warm water soaks 5. Follow-up prn if symptoms worsen or don't improve  Controlled Substance Prescriptions [place] Controlled Substance Registry consulted? Not Applicable   Payton Mccallumonty, Lief Palmatier, MD 07/17/17 1229

## 2017-07-19 ENCOUNTER — Ambulatory Visit
Admission: EM | Admit: 2017-07-19 | Discharge: 2017-07-19 | Disposition: A | Discharge status: Home / Self Care | Payer: BLUE CROSS/BLUE SHIELD | Attending: Family Medicine | Admitting: Family Medicine

## 2017-07-19 ENCOUNTER — Other Ambulatory Visit: Payer: Self-pay

## 2017-07-19 DIAGNOSIS — L03012 Cellulitis of left finger: Secondary | ICD-10-CM

## 2017-07-19 DIAGNOSIS — L02512 Cutaneous abscess of left hand: Secondary | ICD-10-CM

## 2017-07-19 NOTE — Discharge Instructions (Signed)
Continue and finish current antibiotic °

## 2017-07-19 NOTE — ED Triage Notes (Signed)
Patient was seen for paronychia on Sunday and he was placed on antibiotic. Patient states that pain has worsened and pus has started to collect again causing swelling.

## 2017-07-19 NOTE — ED Provider Notes (Signed)
[facility]    CSN: [id number] Arrival date & time: 07/19/17  1024     History   Chief Complaint Chief Complaint  Patient presents with  . Hand Pain    left ring finger    HPI Shaun Dodson is a 52 y.o. male.   HPI  History reviewed. No pertinent past medical history.  Patient Active Problem List   Diagnosis Date Noted  . Hyperlipidemia, mild 05/02/2015  . 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 Arthralgia of hand 05/02/2015    Past Surgical History:  Procedure Laterality Date  . ELBOW SURGERY    . STRABISMUS SURGERY         Home Medications    Prior to Admission medications   Medication Sig Start Date End Date Taking? Authorizing Provider  sulfamethoxazole-trimethoprim (BACTRIM DS,SEPTRA DS) 800-160 MG tablet Take 1 tablet by mouth 2 (two) times daily. 07/17/17  Yes Payton Mccallum, MD  ondansetron (ZOFRAN ODT) 8 MG disintegrating tablet Take 1 tablet (8 mg total) every 8 (eight) hours as needed by mouth. 04/20/17   Payton Mccallum, MD    Family History Family History  Problem Relation Age of Onset  . Diabetes Mother   . Hyperlipidemia Mother     Social History Social History   Tobacco Use  . Smoking status: Never Smoker  . Smokeless tobacco: Never Used  Substance Use Topics  . Alcohol use: Yes    Alcohol/week: 1.2 oz    Types: 2 Standard drinks or equivalent per week  . Drug use: No     Allergies   Compazine [prochlorperazine edisylate]   Review of Systems Review of Systems   Physical Exam Triage Vital Signs ED Triage Vitals  Enc Vitals Group     BP 07/19/17 1057 101/62     Pulse Rate 07/19/17 1057 (!) 52     Resp 07/19/17 1057 18     Temp 07/19/17 1057 97.6 F (36.4 C)     Temp Source 07/19/17 1057 Oral     SpO2 07/19/17 1057 100 %     Weight 07/19/17 1055 145 lb (65.8 kg)     Height 07/19/17 1055 5\' 11"  (1.803 m)     Head Circumference --      Peak Flow --      Pain Score  07/19/17 1055 4     Pain Loc --      Pain Edu? --      Excl. in GC? --    No data found.  Updated Vital Signs BP 101/62 (BP Location: Left Arm)   Pulse (!) 52   Temp 97.6 F (36.4 C) (Oral)   Resp 18   Ht 5\' 11"  (1.803 m)   Wt 145 lb (65.8 kg)   SpO2 100%   BMI 20.22 kg/m   Visual Acuity Right Eye Distance:   Left Eye Distance:   Bilateral Distance:    Right Eye Near:   Left Eye Near:    Bilateral Near:     Physical Exam   UC Treatments / Results  Labs (all labs ordered are listed, but only abnormal results are displayed) Labs Reviewed - No data to display  EKG  EKG Interpretation None       Radiology No results found.  Procedures Incision and Drainage Date/Time: 07/19/2017 2:29 PM Performed by: Payton Mccallum, MD Authorized by: Payton Mccallum, MD   Consent:    Consent obtained:  Verbal   Consent given by:  Patient   Risks discussed:  Bleeding, incomplete drainage, infection and pain   Alternatives discussed:  No treatment Location:    Type:  Abscess ((paronychia))   Location:  Upper extremity   Upper extremity location:  Finger   Finger location:  L ring finger Pre-procedure details:    Skin preparation:  Betadine Anesthesia (see MAR for exact dosages):    Anesthesia method:  None Procedure type:    Complexity:  Simple Procedure details:    Needle aspiration: no     Incision types:  Stab incision   Scalpel blade:  11   Drainage:  Purulent   Drainage amount:  Moderate Post-procedure details:    Patient tolerance of procedure:  Tolerated well, no immediate complications   (including critical care time)  Medications Ordered in UC Medications - No data to display   Initial Impression / Assessment and Plan / UC Course  I have reviewed the triage vital signs and the nursing notes.  Pertinent labs & imaging results that were available during my care of the patient were reviewed by me and considered in my medical decision making (see chart  for details).       Final Clinical Impressions(s) / UC Diagnoses   Final diagnoses:  Paronychia, finger, left    ED Discharge Orders    None     1. diagnosis reviewed with patient 2. Procedure as per note above 3. Continue current antibiotic 4. Recommend supportive treatment with warm compresses to area 5. Follow-up prn if symptoms worsen or don't improve   Controlled Substance Prescriptions [place] Controlled Substance Registry consulted? Not Applicable   Payton Mccallumonty, Donna Snooks, MD 07/19/17 [phone]

## 2017-07-22 ENCOUNTER — Telehealth: Payer: Self-pay | Admitting: Emergency Medicine

## 2017-07-22 NOTE — Telephone Encounter (Signed)
Called to follow up after recent visit. Patient states his finger is much better.

## 2017-10-24 ENCOUNTER — Encounter: Payer: BLUE CROSS/BLUE SHIELD | Admitting: Internal Medicine

## 2018-08-29 ENCOUNTER — Encounter: Payer: Self-pay | Admitting: Internal Medicine

## 2020-03-12 ENCOUNTER — Ambulatory Visit (INDEPENDENT_AMBULATORY_CARE_PROVIDER_SITE_OTHER): Payer: BC Managed Care – PPO

## 2020-03-12 ENCOUNTER — Ambulatory Visit
Admission: EM | Admit: 2020-03-12 | Discharge: 2020-03-12 | Disposition: A | Discharge status: Home / Self Care | Payer: BC Managed Care – PPO | Attending: Family Medicine | Admitting: Family Medicine

## 2020-03-12 ENCOUNTER — Encounter: Payer: Self-pay | Admitting: Emergency Medicine

## 2020-03-12 ENCOUNTER — Other Ambulatory Visit: Payer: Self-pay

## 2020-03-12 DIAGNOSIS — S90121A Contusion of right lesser toe(s) without damage to nail, initial encounter: Secondary | ICD-10-CM

## 2020-03-12 DIAGNOSIS — S92524A Nondisplaced fracture of medial phalanx of right lesser toe(s), initial encounter for closed fracture: Secondary | ICD-10-CM

## 2020-03-12 DIAGNOSIS — W228XXA Striking against or struck by other objects, initial encounter: Secondary | ICD-10-CM

## 2020-03-12 NOTE — ED Triage Notes (Signed)
Pt c/o right 4th toe pain. Pt states he kicked his sofa last night. He has swelling and bruising.

## 2020-03-12 NOTE — Discharge Instructions (Signed)
There is a fracture of the fourth toe.  You can keep this toe buddy taped to the other toe for support.  Try to avoid walking while painful.  Icing and elevate the toe as often as possible.  You can take ibuprofen and Tylenol for pain.  Should heal over the next couple weeks.  If anything worsens follow up with orthopedics  You have a condition requiring you to follow up with Orthopedics so please call one of the following office for appointment:   [facility] [address] [place], [place] [address] Phone: [phone]  [facility] [address] [place], [place] [address] Phone: [phone]

## 2020-03-12 NOTE — ED Provider Notes (Signed)
[facility]    CSN: [id number] Arrival date & time: 03/12/20  0813      History   Chief Complaint Chief Complaint  Patient presents with  . Toe Injury    right 4th toe    HPI Shaun Dodson is a 54 y.o. male presenting for right fourth toe pain.  He states that he accidentally kicked his sofa last night and has subsequent bruising, swelling and pain.  He said his pain had to walk on the foot.  Denies any numbness, tingling, weakness.  Has not iced the foot and has not had to take anything for pain.  Pain is about 8 out of 10 with ambulation.  Denies any previous fractures of this foot.  Denies work-related injury.  Patient otherwise healthy.  No other concerns today.  HPI  History reviewed. No pertinent past medical history.  Patient Active Problem List   Diagnosis Date Noted  . Hyperlipidemia, mild 05/02/2015  . 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 Arthralgia of hand 05/02/2015    Past Surgical History:  Procedure Laterality Date  . ELBOW SURGERY    . STRABISMUS SURGERY         Home Medications    Prior to Admission medications   Medication Sig Start Date End Date Taking? Authorizing Provider  ondansetron (ZOFRAN ODT) 8 MG disintegrating tablet Take 1 tablet (8 mg total) every 8 (eight) hours as needed by mouth. 04/20/17   Payton Mccallum, MD  sulfamethoxazole-trimethoprim (BACTRIM DS,SEPTRA DS) 800-160 MG tablet Take 1 tablet by mouth 2 (two) times daily. 07/17/17   Payton Mccallum, MD    Family History Family History  Problem Relation Age of Onset  . Diabetes Mother   . Hyperlipidemia Mother     Social History Social History   Tobacco Use  . Smoking status: Never Smoker  . Smokeless tobacco: Never Used  Vaping Use  . Vaping Use: Never used  Substance Use Topics  . Alcohol use: Yes    Alcohol/week: 2.0 standard drinks    Types: 2 Standard drinks or equivalent per week  . Drug use: No      Allergies   Compazine [prochlorperazine edisylate]   Review of Systems Review of Systems  Constitutional: Negative for fatigue and fever.  Musculoskeletal: Positive for arthralgias, gait problem and joint swelling.  Skin: Positive for color change. Negative for rash and wound.  Neurological: Negative for weakness and numbness.  Hematological: Does not bruise/bleed easily.     Physical Exam Triage Vital Signs ED Triage Vitals  Enc Vitals Group     BP 03/12/20 0855 131/76     Pulse Rate 03/12/20 0855 63     Resp 03/12/20 0855 18     Temp 03/12/20 0855 98 F (36.7 C)     Temp Source 03/12/20 0855 Oral     SpO2 03/12/20 0855 100 %     Weight 03/12/20 0852 145 lb 1 oz (65.8 kg)     Height 03/12/20 0852 5\' 11"  (1.803 m)     Head Circumference --      Peak Flow --      Pain Score 03/12/20 0851 3     Pain Loc --      Pain Edu? --      Excl. in GC? --    No data found.  Updated Vital Signs BP 131/76 (BP Location: Left Arm)   Pulse 63   Temp 98 F (36.7 C) (Oral)  Resp 18   Ht 5\' 11"  (1.803 m)   Wt 145 lb 1 oz (65.8 kg)   SpO2 100%   BMI 20.23 kg/m       Physical Exam Vitals and nursing note reviewed.  Constitutional:      General: He is not in acute distress.    Appearance: Normal appearance. He is well-developed and normal weight. He is not ill-appearing.  HENT:     Head: Normocephalic and atraumatic.  Eyes:     General: No scleral icterus.    Conjunctiva/sclera: Conjunctivae normal.  Cardiovascular:     Rate and Rhythm: Normal rate and regular rhythm.     Pulses: Normal pulses.  Pulmonary:     Effort: Pulmonary effort is normal.  Musculoskeletal:     Cervical back: Neck supple.     Left foot: Normal range of motion and normal capillary refill. Swelling (entire toe with associated bruising) and bony tenderness (middle phalanx) present. Normal pulse.  Skin:    General: Skin is warm and dry.     Findings: Bruising present.  Neurological:      General: No focal deficit present.     Mental Status: He is alert. Mental status is at baseline.     Motor: No weakness.     Gait: Gait abnormal.  Psychiatric:        Mood and Affect: Mood normal.        Behavior: Behavior normal.        Thought Content: Thought content normal.      UC Treatments / Results  Labs (all labs ordered are listed, but only abnormal results are displayed) Labs Reviewed - No data to display  EKG   Radiology DG Toe 4th Right  Result Date: 03/12/2020 CLINICAL DATA:  Injury last evening, pain, swelling and bruising mostly at PIP joint RIGHT fourth toe EXAM: RIGHT FOURTH TOE COMPARISON:  None FINDINGS: Osseous mineralization normal for technique. Joint spaces preserved. Nondisplaced fracture identified at dorsal aspect base of middle phalanx. No intra-articular extension visualized. No additional fracture, dislocation, or bone destruction. IMPRESSION: Nondisplaced fracture at base of middle phalanx RIGHT fourth toe. Electronically Signed   By: 05/12/2020 M.D.   On: 03/12/2020 09:16    Procedures Procedures (including critical care time)  Medications Ordered in UC Medications - No data to display  Initial Impression / Assessment and Plan / UC Course  I have reviewed the triage vital signs and the nursing notes.  Pertinent labs & imaging results that were available during my care of the patient were reviewed by me and considered in my medical decision making (see chart for details).   Imaging shows fracture of the fourth middle phalanx.  I buddy taped the fourth digit to the third digit.  Advised RICE and NSAID/Tylenol for pain relief.  Follow-up with Ortho if having continued pain or any worsening symptoms over the next couple weeks.  Discussed that it will heal on its own.  Final Clinical Impressions(s) / UC Diagnoses   Final diagnoses:  Contusion of right lesser toe(s) without damage to nail, initial encounter  Closed nondisplaced fracture of middle  phalanx of lesser toe of right foot, initial encounter     Discharge Instructions     There is a fracture of the fourth toe.  You can keep this toe buddy taped to the other toe for support.  Try to avoid walking while painful.  Icing and elevate the toe as often as possible.  You can take  ibuprofen and Tylenol for pain.  Should heal over the next couple weeks.  If anything worsens follow up with orthopedics  You have a condition requiring you to follow up with Orthopedics so please call one of the following office for appointment:   [facility] [address] [place], [place] [address] Phone: [phone]  [facility] [address] [place], [place] [address] Phone: [phone]     ED Prescriptions    None     PDMP not reviewed this encounter.   Shirlee Latch, PA-C 03/12/20 [phone]

## 2020-12-30 ENCOUNTER — Ambulatory Visit
Admission: EM | Admit: 2020-12-30 | Discharge: 2020-12-30 | Disposition: A | Discharge status: Home / Self Care | Payer: BC Managed Care – PPO | Attending: Sports Medicine | Admitting: Sports Medicine

## 2020-12-30 ENCOUNTER — Other Ambulatory Visit: Payer: Self-pay

## 2020-12-30 DIAGNOSIS — S46911A Strain of unspecified muscle, fascia and tendon at shoulder and upper arm level, right arm, initial encounter: Secondary | ICD-10-CM

## 2020-12-30 DIAGNOSIS — M25511 Pain in right shoulder: Secondary | ICD-10-CM

## 2020-12-30 DIAGNOSIS — M7541 Impingement syndrome of right shoulder: Secondary | ICD-10-CM

## 2020-12-30 MED ORDER — ETODOLAC 500 MG PO TABS: 500.0000 mg | ORAL_TABLET | Freq: Two times a day (BID) | ORAL | 0 refills | Status: DC

## 2020-12-30 NOTE — Discharge Instructions (Addendum)
As we discussed, your exam is consistent with a shoulder strain. I prescribed an anti-inflammatory.  No Motrin, Advil, ibuprofen, Naprosyn, Aleve, or aspirin products.  He can take Tylenol if he needs something extra. Educational handouts provided. I provided you the names of 2 orthopedic groups and you can call them for follow-up. If symptoms persist and you cannot get into orthopedics please see your primary care provider. Certainly if symptoms were to worsen in any way please go to the emergency room. Lots of icing and no heat. I provided you a work note keeping up today but you can return to work tomorrow.

## 2020-12-30 NOTE — ED Triage Notes (Signed)
Pt c/o right shoulder pain since last night. Pt reports he picked up a pressure washer last night and thinks he may have strained his shoulder. Pt does have slight decreased ROM in the shoulder. Pt denies any radiating pain.

## 2021-01-01 NOTE — ED Provider Notes (Signed)
[facility]    CSN: [id number] Arrival date & time: 12/30/20  1110      History   Chief Complaint Chief Complaint  Patient presents with   Shoulder Pain    right    HPI Shaun Dodson is a 55 y.o. male.   55 year old male who presents for evaluation of pain in his right shoulder.  He is right-hand dominant.  He reports he was in his garage last evening and he lifted a pressure washer and it kind of slipped and he caught it while he was falling.  He did not hear or feel a pop.  No neck pain numbness or tingling.  He is never any problems with the shoulder in the past.  He has never had any surgery.  He has some discomfort with abduction and external rotation.  Normally sees [facility] in [place] for his ongoing medical care.  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  This is not a work-related injury.  No other issues or problems are offered.  No red flag signs or symptoms elicited on history.   History reviewed. No pertinent past medical history.  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 Authorizing Provider  etodolac (LODINE) 500 MG tablet Take 1 tablet (500 mg total) by mouth 2 (two) times daily. 12/30/20  Yes Delton See, MD  ondansetron (ZOFRAN ODT) 8 MG disintegrating tablet Take 1 tablet (8 mg total) every 8 (eight) hours as needed by mouth. 04/20/17   Payton Mccallum, MD  sulfamethoxazole-trimethoprim (BACTRIM DS,SEPTRA DS) 800-160 MG tablet Take 1 tablet by mouth 2 (two) times daily. 07/17/17   Payton Mccallum, MD    Family History Family History  Problem Relation Age of Onset    Diabetes Mother    Hyperlipidemia Mother     Social History Social History   Tobacco Use   Smoking status: Never   Smokeless tobacco: Never  Vaping Use   Vaping Use: Never used  Substance Use Topics   Alcohol use: Yes    Alcohol/week: 2.0 standard drinks    Types: 2 Standard drinks or equivalent per week   Drug use: No     Allergies   Prochlorperazine edisylate   Review of Systems Review of Systems  Constitutional:  Positive for activity change. Negative for appetite change, chills, diaphoresis, fatigue and fever.  HENT:  Negative for congestion, ear pain, postnasal drip, rhinorrhea, sinus pressure, sinus pain, sneezing and sore throat.   Eyes:  Negative for pain.  Respiratory:  Negative for cough, chest tightness and shortness of breath.   Cardiovascular:  Negative for chest pain and palpitations.  Gastrointestinal:  Negative for abdominal pain, diarrhea, nausea and vomiting.  Genitourinary:  Negative for dysuria.  Musculoskeletal:  Positive for arthralgias. Negative for back pain, myalgias and neck pain.  Skin:  Negative for color change, pallor, rash and wound.  Neurological:  Negative for dizziness, light-headedness, numbness and headaches.  All other systems reviewed and are negative.   Physical Exam Triage Vital Signs ED Triage Vitals  Enc Vitals Group  BP 12/30/20 1302 112/73     Pulse Rate 12/30/20 1302 (!) 58     Resp 12/30/20 1302 18     Temp 12/30/20 1302 97.8 F (36.6 C)     Temp Source 12/30/20 1302 Oral     SpO2 12/30/20 1302 100 %     Weight 12/30/20 1300 155 lb (70.3 kg)     Height 12/30/20 1300 5\' 11"  (1.803 m)     Head Circumference --      Peak Flow --      Pain Score 12/30/20 1259 6     Pain Loc --      Pain Edu? --      Excl. in GC? --    No data found.  Updated Vital Signs BP 112/73 (BP Location: Left Arm)   Pulse (!) 58   Temp 97.8 F (36.6 C) (Oral)   Resp 18   Ht 5\' 11"  (1.803 m)   Wt 70.3 kg   SpO2 100%   BMI 21.62  kg/m   Visual Acuity Right Eye Distance:   Left Eye Distance:   Bilateral Distance:    Right Eye Near:   Left Eye Near:    Bilateral Near:     Physical Exam Vitals and nursing note reviewed.  Constitutional:      General: He is not in acute distress.    Appearance: Normal appearance. He is not ill-appearing, toxic-appearing or diaphoretic.  HENT:     Head: Normocephalic and atraumatic.     Nose: Nose normal.     Mouth/Throat:     Mouth: Mucous membranes are moist.  Eyes:     Extraocular Movements: Extraocular movements intact.     Conjunctiva/sclera: Conjunctivae normal.     Pupils: Pupils are equal, round, and reactive to light.  Cardiovascular:     Rate and Rhythm: Normal rate and regular rhythm.     Pulses: Normal pulses.     Heart sounds: Normal heart sounds. No murmur heard.   No friction rub. No gallop.  Pulmonary:     Effort: Pulmonary effort is normal.     Breath sounds: Normal breath sounds. No stridor. No wheezing, rhonchi or rales.  Musculoskeletal:     Cervical back: Normal range of motion and neck supple.     Comments: Cervical spine: He has full active range of motion.  Strength is well-preserved.  Spurling's test is negative bilaterally.  Left shoulder: Normal to inspection palpation range of motion special test.  Right shoulder: No obvious bony abnormality ecchymosis erythema or soft tissue swelling.  He does have full active range of motion.  A little discomfort with external rotation and abduction.  Has a positive Hawkins test and a positive impingement sign.  There is no evidence of any rotator cuff weakness.  Strength is well-preserved.  Neurovascular: Normal sensation and 2+ pulses.  Skin:    General: Skin is warm and dry.     Capillary Refill: Capillary refill takes less than 2 seconds.  Neurological:     General: No focal deficit present.     Mental Status: He is alert and oriented to person, place, and time.     UC Treatments / Results   Labs (all labs ordered are listed, but only abnormal results are displayed) Labs Reviewed - No data to display  EKG   Radiology No results found.  Procedures Procedures (including critical care time)  Medications Ordered in UC Medications - No data to display  Initial Impression /  Assessment and Plan / UC Course  I have reviewed the triage vital signs and the nursing notes.  Pertinent labs & imaging results that were available during my care of the patient were reviewed by me and considered in my medical decision making (see chart for details).  Clinical impression: 1.  Cute onset of right shoulder pain status post injury 2.  Strain of the right shoulder. 3.  Impingement syndrome of the right shoulder.  Treatment plan: 1.  The findings and treatment plan were discussed in detail with patient.  Patient was in agreement. 2.  I had a long discussion with my findings.  It is consistent with shoulder strain. 3.  I prescribed Lodine for 2 weeks.  Warned him not to take any other anti-inflammatories. 4.  Educational handouts provided. 5.  Provided the names of 2 orthopedic groups and he can follow-up if he still having issues. 6.  Want him to ice and not use heat initially.  He can transition to heat once he gets through the acute phase. 7.  If his symptoms were to worsen then he should go to the ER if he cannot get into orthopedics. 8.  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 9.  He was stable upon discharge and he will follow-up here as needed.    Final Clinical Impressions(s) / UC Diagnoses   Final diagnoses:  Strain of right shoulder, initial encounter  Acute pain of right shoulder  Impingement syndrome of right shoulder     Discharge Instructions      As we discussed, your exam is consistent with a shoulder strain. I prescribed an anti-inflammatory.  No Motrin, Advil, ibuprofen, Naprosyn, Aleve, or aspirin products.   He can take Tylenol if he needs something extra. Educational handouts provided. I provided you the names of 2 orthopedic groups and you can call them for follow-up. If symptoms persist and you cannot get into orthopedics please see your primary care provider. Certainly if symptoms were to worsen in any way please go to the emergency room. Lots of icing and no heat. I provided you a work note keeping up today but you can return to work tomorrow.     ED Prescriptions     Medication Sig Dispense Auth. Provider   etodolac (LODINE) 500 MG tablet Take 1 tablet (500 mg total) by mouth 2 (two) times daily. 30 tablet Delton See, MD      PDMP not reviewed this encounter.   Delton See, MD 01/01/21 [place]

## 2021-01-08 IMAGING — CR DG TOE 4TH 2+V*R*
3 series · 3 of 3 positions shown · non-contrast
Comparison: None

CLINICAL DATA: Injury last evening, pain, swelling and bruising
mostly at PIP joint RIGHT fourth toe

EXAM:
RIGHT FOURTH TOE

[toe ap]
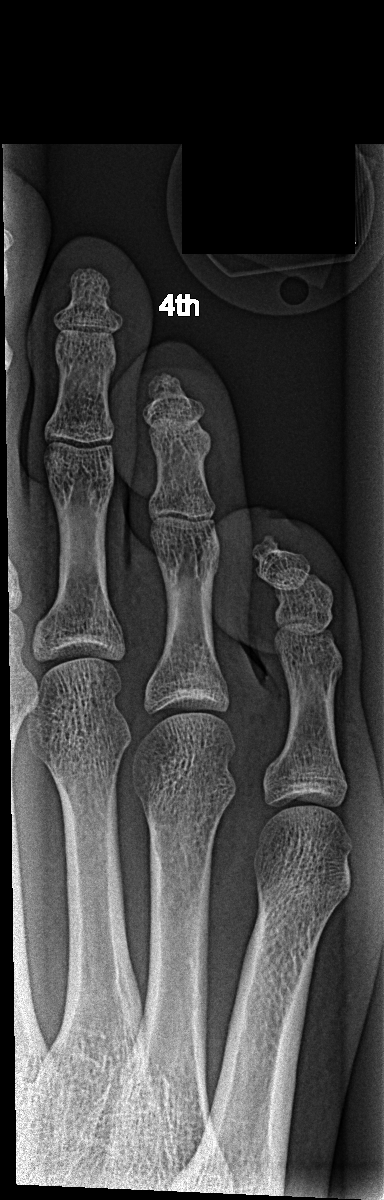

[toe obl]
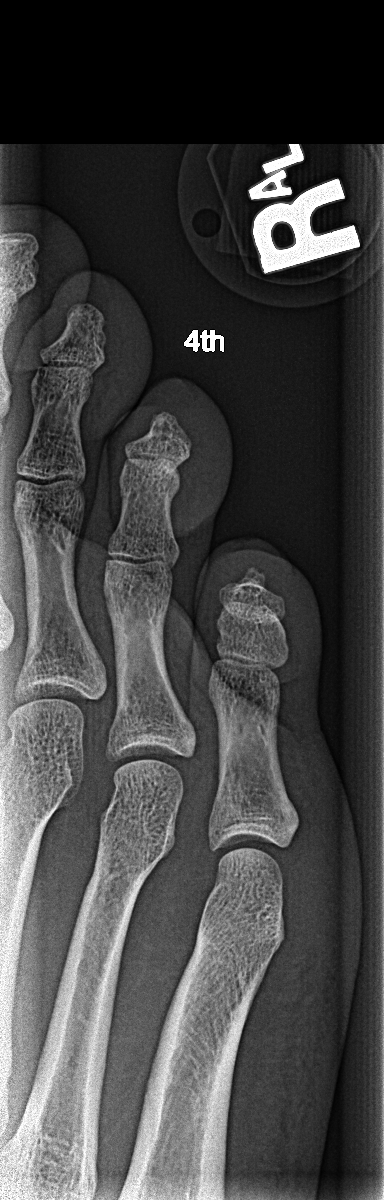

[toe lat]
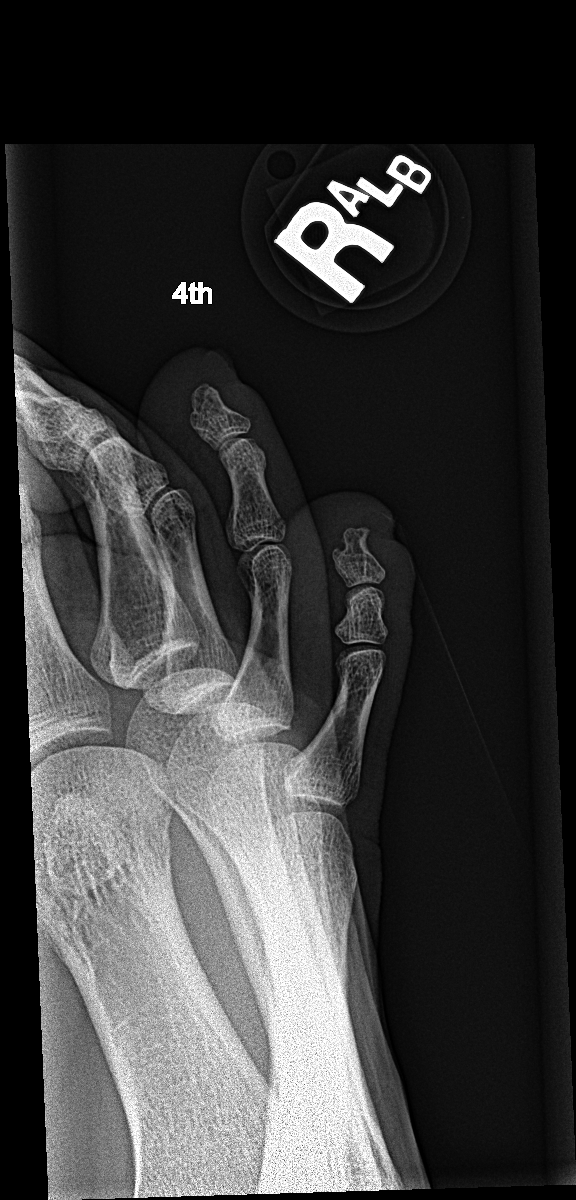

[3 of 3 positions shown; findings below may reference images not displayed]

FINDINGS: Osseous mineralization normal for technique.

Joint spaces preserved.

Nondisplaced fracture identified at dorsal aspect base of middle
phalanx.

No intra-articular extension visualized.

No additional fracture, dislocation, or bone destruction.
IMPRESSION: Nondisplaced fracture at base of middle phalanx RIGHT fourth toe.

## 2021-07-28 ENCOUNTER — Ambulatory Visit
Admission: EM | Admit: 2021-07-28 | Discharge: 2021-07-28 | Disposition: A | Discharge status: Home / Self Care | Payer: BC Managed Care – PPO | Attending: Emergency Medicine | Admitting: Emergency Medicine

## 2021-07-28 ENCOUNTER — Other Ambulatory Visit: Payer: Self-pay

## 2021-07-28 DIAGNOSIS — K529 Noninfective gastroenteritis and colitis, unspecified: Secondary | ICD-10-CM

## 2021-07-28 DIAGNOSIS — Z20822 Contact with and (suspected) exposure to covid-19: Secondary | ICD-10-CM | POA: Diagnosis not present

## 2021-07-28 LAB — RESP PANEL BY RT-PCR (FLU A&B, COVID) ARPGX2
Influenza A by PCR: NEGATIVE
Influenza B by PCR: NEGATIVE
SARS Coronavirus 2 by RT PCR: NEGATIVE

## 2021-07-28 MED ORDER — ONDANSETRON 8 MG PO TBDP: 8.0000 mg | ORAL_TABLET | Freq: Three times a day (TID) | ORAL | 0 refills | Status: DC | PRN

## 2021-07-28 NOTE — ED Triage Notes (Signed)
Pt c/o fatigue, body chills, vomiting, diarrhea x1day.   Pt states that at 3:45pm on 07/27/21 he has had projectile vomiting, diarrhea, and body chills. Pt states that he woke up today with a headache. Pt was at work and suddenly felt nauseas and began to vomit and have diarrhea. Pt does not have nausea currently but has had diarrhea this morning at 7:30am.  Pt denies any changes of food or diet and has not been around anyone who has been sick.   Pt took ibuprofen this morning at 5:30pm.

## 2021-07-28 NOTE — ED Provider Notes (Signed)
[facility]    CSN: [id number] Arrival date & time: 07/28/21  0800      History   Chief Complaint Chief Complaint  Patient presents with   Weakness   Diarrhea   Vomiting    HPI Shaun Dodson is a 56 y.o. male.   HPI  56 year old male here for evaluation of GI complaints.  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  He states that he had approximately 5-7 episodes of forceful vomiting down into the toilet.  [place] after continuing his route and had some more episodes of forceful vomiting.  He was also complaining of severe chills that required a blanket andhere, body aches, and fatigue.  This morning he woke up with a headache.  He reports that he had approximately 3 episodes of vomiting with multiple episodes within each of those major episodes and approximately 6 episodes of watery diarrhea.  He denies any measured fever, runny nose nasal congestion, ear pain, sore throat, cough, shortness breath or wheezing, blood in his vomit or blood in his stool.  He also denies any abdominal pain.  He states that he has been eating the same thing for lunch as he has a sensitive stomach which is typically a peanut butter jelly sandwich, [place] sandwich, chips of some kind, and yogurt.  He states that he did not get to his yogurt yesterday.  Nothing tasted funny.  He denies any sick contacts.  History reviewed. No pertinent past medical history.  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 Authorizing Provider  ondansetron  (ZOFRAN-ODT) 8 MG disintegrating tablet Take 1 tablet (8 mg total) by mouth every 8 (eight) hours as needed for nausea or vomiting. 07/28/21  Yes Becky Augusta, NP    Family History Family History  Problem Relation Age of Onset   Diabetes Mother    Hyperlipidemia Mother     Social History Social History   Tobacco Use   Smoking status: Never   Smokeless tobacco: Never  Vaping Use   Vaping Use: Never used  Substance Use Topics   Alcohol use: Yes    Alcohol/week: 2.0 standard drinks    Types: 2 Standard drinks or equivalent per week   Drug use: No     Allergies   Prochlorperazine edisylate   Review of Systems Review of Systems  Constitutional:  Positive for chills and fatigue. Negative for fever.  HENT:  Negative for congestion, ear pain, rhinorrhea and sore throat.   Respiratory:  Negative for cough, shortness of breath and wheezing.   Gastrointestinal:  Positive for diarrhea, nausea and vomiting. Negative for abdominal pain and blood in stool.  Musculoskeletal:  Positive for arthralgias and myalgias.  Skin:  Negative for rash.  Neurological:  Positive for headaches.  Hematological: Negative.   Psychiatric/Behavioral: Negative.      Physical Exam Triage Vital Signs ED Triage Vitals [07/28/21 0814]  Enc Vitals Group     BP      Pulse  Resp      Temp      Temp src      SpO2      Weight 153 lb (69.4 kg)     Height 5\' 11"  (1.803 m)     Head Circumference      Peak Flow      Pain Score 4     Pain Loc      Pain Edu?      Excl. in GC?    No data found.  Updated Vital Signs BP 117/79 (BP Location: Left Arm)    Pulse 81    Temp 98.4 F (36.9 C) (Oral)    Resp 18    Ht 5\' 11"  (1.803 m)    Wt 153 lb (69.4 kg)    SpO2 100%    BMI 21.34 kg/m   Visual Acuity Right Eye Distance:   Left Eye Distance:   Bilateral Distance:    Right Eye Near:   Left Eye Near:    Bilateral Near:     Physical Exam Vitals and nursing note reviewed.  Constitutional:       Appearance: Normal appearance. He is not ill-appearing.  HENT:     Head: Normocephalic and atraumatic.  Cardiovascular:     Rate and Rhythm: Normal rate and regular rhythm.     Pulses: Normal pulses.     Heart sounds: Normal heart sounds. No murmur heard.   No friction rub. No gallop.  Pulmonary:     Effort: Pulmonary effort is normal.     Breath sounds: Normal breath sounds. No wheezing, rhonchi or rales.  Abdominal:     General: Abdomen is flat. Bowel sounds are normal.     Palpations: Abdomen is soft.     Tenderness: There is no abdominal tenderness. There is no guarding or rebound.  Skin:    General: Skin is warm and dry.     Capillary Refill: Capillary refill takes less than 2 seconds.     Findings: No erythema or rash.  Neurological:     General: No focal deficit present.     Mental Status: He is alert and oriented to person, place, and time.  Psychiatric:        Mood and Affect: Mood normal.        Behavior: Behavior normal.        Thought Content: Thought content normal.        Judgment: Judgment normal.     UC Treatments / Results  Labs (all labs ordered are listed, but only abnormal results are displayed) Labs Reviewed  RESP PANEL BY RT-PCR (FLU A&B, COVID) [id number]    EKG   Radiology No results found.  Procedures Procedures (including critical care time)  Medications Ordered in UC Medications - No data to display  Initial Impression / Assessment and Plan / UC Course  I have reviewed the triage vital signs and the nursing notes.  Pertinent labs & imaging results that were available during my care of the patient were reviewed by me and considered in my medical decision making (see chart for details).  Patient is a pleasant, nontoxic-appearing 56 year old male here for evaluation of GI complaints as outlined HPI above.  Patient's vital signs are normal with a heart rate of 81, respirate 18, BP 117/79, and SPO2 100% on room air.  He is afebrile at 98.4.  His  physical exam reveals a benign cardiopulmonary exam with S1-S2 heart sounds with regular rate and rhythm.  Lung sounds  are clear to auscultation all fields.  Abdomen is flat, soft, nontender, with positive bowel sounds in all 4 quadrants.  Given patient's body aches and chills and concerned that he may have COVID or influenza.  We will swab him for both.  Include in the differential is also gastroenteritis as we are seeing GI illness in the community.  He denies any contact with small children.  He is not nauseous or experiencing vomiting at this time.  Respiratory panel is negative for COVID and influenza.  I will discharge patient home with a diagnosis of viral gastroenteritis and treat him with Zofran as needed for nausea and encourage oral rehydration.  Work note provided.  Return precautions reviewed.   Final Clinical Impressions(s) / UC Diagnoses   Final diagnoses:  Gastroenteritis     Discharge Instructions      Take the Zofran every 8 hours as needed for nausea and vomiting.  They are an oral disintegrating tablet and you can place them on her under your tongue and then will be absorbed.  Follow a clear liquid diet for the next 6 to 12 hours.  Clear liquids consist of broth, ginger ale, water, Pedialyte, and Jell-O.  After 6 to 12 hours, if you are tolerating clear liquids, you can advance to bland foods such as bananas, rice, applesauce, and toast.  If you tolerate bland foods you can continue to advance your diet as you see fit.  If you develop a fever over 100.5, increased abdominal pain, bloody vomit, or bloody stool return for reevaluation or go to the ER.      ED Prescriptions     Medication Sig Dispense Auth. Provider   ondansetron (ZOFRAN-ODT) 8 MG disintegrating tablet Take 1 tablet (8 mg total) by mouth every 8 (eight) hours as needed for nausea or vomiting. 20 tablet Becky Augusta, NP      PDMP not reviewed this encounter.   Becky Augusta, NP 07/28/21 [phone]

## 2021-07-28 NOTE — Discharge Instructions (Addendum)
Take the Zofran every 8 hours as needed for nausea and vomiting.  They are an oral disintegrating tablet and you can place them on her under your tongue and then will be absorbed.  Follow a clear liquid diet for the next 6 to 12 hours.  Clear liquids consist of broth, ginger ale, water, Pedialyte, and Jell-O.  After 6 to 12 hours, if you are tolerating clear liquids, you can advance to bland foods such as bananas, rice, applesauce, and toast.  If you tolerate bland foods you can continue to advance your diet as you see fit.  If you develop a fever over 100.5, increased abdominal pain, bloody vomit, or bloody stool return for reevaluation or go to the ER.  

## 2022-01-18 ENCOUNTER — Ambulatory Visit
Admission: EM | Admit: 2022-01-18 | Discharge: 2022-01-18 | Disposition: A | Discharge status: Home / Self Care | Payer: BC Managed Care – PPO | Attending: Internal Medicine | Admitting: Internal Medicine

## 2022-01-18 ENCOUNTER — Ambulatory Visit (INDEPENDENT_AMBULATORY_CARE_PROVIDER_SITE_OTHER): Payer: BC Managed Care – PPO

## 2022-01-18 ENCOUNTER — Encounter: Payer: Self-pay | Admitting: Emergency Medicine

## 2022-01-18 DIAGNOSIS — M79645 Pain in left finger(s): Secondary | ICD-10-CM | POA: Diagnosis not present

## 2022-01-18 DIAGNOSIS — S60022A Contusion of left index finger without damage to nail, initial encounter: Secondary | ICD-10-CM | POA: Diagnosis not present

## 2022-01-18 NOTE — ED Provider Notes (Signed)
[facility]    CSN: [id number] Arrival date & time: 01/18/22  0809      History   Chief Complaint Chief Complaint  Patient presents with   Finger Injury    HPI Shaun Dodson is a 56 y.o. male who presents with L index finger pain since after shutting it between a door yesterday pm at home. Denies past hx of fracture of this finger. Denies paresthesia or trouble moving it.     History reviewed. No pertinent past medical history.  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 Authorizing Provider  ondansetron (ZOFRAN-ODT) 8 MG disintegrating tablet Take 1 tablet (8 mg total) by mouth every 8 (eight) hours as needed for nausea or vomiting. 07/28/21   Becky Augusta, NP    Family History Family History  Problem Relation Age of Onset   Diabetes Mother    Hyperlipidemia Mother     Social History Social History   Tobacco Use   Smoking status: Never   Smokeless tobacco: Never  Vaping Use   Vaping Use: Never used  Substance Use Topics   Alcohol use: Yes    Alcohol/week: 2.0 standard drinks of alcohol    Types: 2 Standard drinks or equivalent per week   Drug use: No     Allergies   Prochlorperazine edisylate   Review of Systems Review of Systems  Musculoskeletal:  Positive for arthralgias and joint swelling. Negative for gait problem.  Skin:  Positive for color change. Negative for rash and wound.  Neurological:  Negative for weakness and numbness.     Physical Exam Triage Vital Signs ED Triage Vitals  Enc Vitals Group     BP 01/18/22 0819 106/69     Pulse Rate 01/18/22 0819 (!) 57     Resp  01/18/22 0819 16     Temp 01/18/22 0819 98 F (36.7 C)     Temp Source 01/18/22 0819 Oral     SpO2 01/18/22 0819 100 %     Weight --      Height --      Head Circumference --      Peak Flow --      Pain Score 01/18/22 0818 0     Pain Loc --      Pain Edu? --      Excl. in GC? --    No data found.  Updated Vital Signs BP 106/69 (BP Location: Left Arm)   Pulse (!) 57   Temp 98 F (36.7 C) (Oral)   Resp 16   SpO2 100%   Visual Acuity Right Eye Distance:   Left Eye Distance:   Bilateral Distance:    Right Eye Near:   Left Eye Near:    Bilateral Near:     Physical Exam Vitals and nursing note reviewed.  Constitutional:      General: He is not in acute distress.    Appearance: He is not toxic-appearing.  HENT:     Right Ear: External ear normal.     Left Ear: External ear normal.  Eyes:  General: No scleral icterus.    Conjunctiva/sclera: Conjunctivae normal.  Pulmonary:     Effort: Pulmonary effort is normal.  Musculoskeletal:        General: Normal range of motion.     Cervical back: Neck supple.     Comments: Has mild swelling of mid phalanx region  Skin:    General: Skin is warm and dry.     Findings: Bruising present.     Comments: Mild bruising on mid phalanx region  Neurological:     Mental Status: He is alert and oriented to person, place, and time.     Sensory: No sensory deficit.     Motor: No weakness.     Gait: Gait normal.  Psychiatric:        Mood and Affect: Mood normal.        Behavior: Behavior normal.        Thought Content: Thought content normal.        Judgment: Judgment normal.      UC Treatments / Results  Labs (all labs ordered are listed, but only abnormal results are displayed) Labs Reviewed - No data to display  EKG   Radiology DG Finger Index Left  Result Date: 01/18/2022 CLINICAL DATA:  Trauma, pain EXAM: LEFT INDEX FINGER 2+V COMPARISON:  None Available. FINDINGS: No fracture or dislocation is seen. There are no  opaque foreign bodies. IMPRESSION: No fracture or dislocation is seen in left index finger. Electronically Signed   By: Ernie Avena M.D.   On: 01/18/2022 08:51    Procedures Procedures (including critical care time)  Medications Ordered in UC Medications - No data to display  Initial Impression / Assessment and Plan / UC Course  I have reviewed the triage vital signs and the nursing notes.  Pertinent  imaging results that were available during my care of the patient were reviewed by me and considered in my medical decision making (see chart for details).  Contusion L index finger  I placed a finger splint on his. See instructions    Final Clinical Impressions(s) / UC Diagnoses   Final diagnoses:  Contusion of left index finger without damage to nail, initial encounter     Discharge Instructions      Ice area of pain for 15 minutes 3-4 times a day today and tomorrow You may take Ibuprofen up to 800 mg three times a day as needed for pain Wear the splint for 7 days to protect your finger while at work. Follow up with your primary care doctor in one week     ED Prescriptions   None    PDMP not reviewed this encounter.   Garey Ham, [place] 01/18/22 [phone]

## 2022-01-18 NOTE — Discharge Instructions (Addendum)
Ice area of pain for 15 minutes 3-4 times a day today and tomorrow You may take Ibuprofen up to 800 mg three times a day as needed for pain Wear the splint for 7 days to protect your finger while at work. Follow up with your primary care doctor in one week

## 2022-01-18 NOTE — ED Triage Notes (Signed)
Pt smashed left pointer finger in the door last night.

## 2023-07-05 ENCOUNTER — Ambulatory Visit
Admission: EM | Admit: 2023-07-05 | Discharge: 2023-07-05 | Disposition: A | Discharge status: Home / Self Care | Payer: BC Managed Care – PPO

## 2023-07-05 ENCOUNTER — Encounter: Payer: Self-pay | Admitting: Emergency Medicine

## 2023-07-05 DIAGNOSIS — R112 Nausea with vomiting, unspecified: Secondary | ICD-10-CM

## 2023-07-05 DIAGNOSIS — R197 Diarrhea, unspecified: Secondary | ICD-10-CM | POA: Diagnosis not present

## 2023-07-05 DIAGNOSIS — A084 Viral intestinal infection, unspecified: Secondary | ICD-10-CM | POA: Diagnosis not present

## 2023-07-05 HISTORY — DX: Hyperlipidemia, unspecified: E78.5

## 2023-07-05 MED ORDER — ONDANSETRON 4 MG PO TBDP: 4.0000 mg | ORAL_TABLET | Freq: Four times a day (QID) | ORAL | 0 refills | Status: AC | PRN

## 2023-07-05 NOTE — Discharge Instructions (Signed)
You may take Tylenol for pain relief. Use medications as directed including antiemetics and antidiarrheal medications if suggested or prescribed. You should increase fluids and electrolytes as well as rest over these next several days. If you have any questions or concerns, or if your symptoms are not improving or if especially if they acutely worsen, please call or stop back to the clinic immediately and we will be happy to help you or go to the ER   ABDOMINAL PAIN RED FLAGS: Seek immediate further care if: symptoms remain the same or worsen over the next 3-7 days, you are unable to keep fluids down, you see blood or mucus in your stool, you vomit black or dark red material, you have a fever of 101.F or higher, you have localized and/or persistent abdominal pain

## 2023-07-05 NOTE — ED Triage Notes (Signed)
Pt c/o fatigue, diarrhea and vomiting that started this morning.

## 2023-07-05 NOTE — ED Provider Notes (Signed)
[facility]    CSN: [id number] Arrival date & time: 07/05/23  1557      History   Chief Complaint Chief Complaint  Patient presents with   Emesis   Diarrhea    HPI Shaun Dodson is a 58 y.o. male presenting for fatigue, loss of appetite, nausea/vomiting and diarrhea that began this morning around 1 AM.  Denies fever, abdominal pain, cough, congestion, sore throat, chest pain, wheezing, shortness of breath, blood in stool.  Has been around and someone with similar symptoms.  Has taken Pepto-Bismol and ginger ale.  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 Authorizing Provider  ondansetron (ZOFRAN-ODT) 4 MG disintegrating tablet Take 1 tablet (4 mg total) by mouth every 6 (six) hours as needed. 07/05/23  Yes Shirlee Latch, PA-C  simvastatin (ZOCOR) 20 MG tablet Take by mouth. 01/02/23 01/02/24 Yes [provider]    Family History Family History  Problem Relation Age of Onset   Diabetes Mother    Hyperlipidemia Mother     Social History Social History   Tobacco Use   Smoking status: Never   Smokeless tobacco: Never  Vaping Use   Vaping status: Never Used  Substance Use Topics   Alcohol use: Yes    Alcohol/week: 2.0 standard drinks of alcohol    Types: 2 Standard drinks or equivalent per week   Drug use: No     Allergies   Cat dander and Prochlorperazine edisylate   Review of Systems Review of Systems  Constitutional:  Positive for appetite change and fatigue. Negative for fever.  HENT:  Negative for congestion,  rhinorrhea, sinus pressure, sinus pain and sore throat.   Respiratory:  Negative for cough and shortness of breath.   Gastrointestinal:  Positive for abdominal pain, diarrhea, nausea and vomiting.  Musculoskeletal:  Negative for myalgias.  Neurological:  Negative for weakness, light-headedness and headaches.  Hematological:  Negative for adenopathy.     Physical Exam Triage Vital Signs ED Triage Vitals  Encounter Vitals Group     BP 07/05/23 1630 118/80     Systolic BP Percentile --      Diastolic BP Percentile --      Pulse Rate 07/05/23 1630 93     Resp 07/05/23 1630 16     Temp 07/05/23 1630 98.5 F (36.9 C)     Temp Source 07/05/23 1630 Oral     SpO2 07/05/23 1630 98 %     Weight --      Height --      Head Circumference --      Peak Flow --      Pain Score 07/05/23 1629 0     Pain Loc --      Pain Education --      Exclude from Growth Chart --    No data found.  Updated Vital Signs BP 118/80 (BP Location: Left Arm)   Pulse 93   Temp 98.5 F (36.9 C) (Oral)   Resp 16  SpO2 98%      Physical Exam Vitals and nursing note reviewed.  Constitutional:      General: He is not in acute distress.    Appearance: Normal appearance. He is well-developed. He is not ill-appearing.  HENT:     Head: Normocephalic and atraumatic.     Nose: Nose normal.     Mouth/Throat:     Mouth: Mucous membranes are moist.     Pharynx: Oropharynx is clear.  Eyes:     Conjunctiva/sclera: Conjunctivae normal.  Cardiovascular:     Rate and Rhythm: Normal rate and regular rhythm.     Heart sounds: Normal heart sounds.  Pulmonary:     Effort: Pulmonary effort is normal. No respiratory distress.     Breath sounds: Normal breath sounds.  Abdominal:     Palpations: Abdomen is soft.     Tenderness: There is no abdominal tenderness. There is no guarding or rebound.  Musculoskeletal:     Cervical back: Neck supple.  Skin:    General: Skin is warm and dry.     Capillary Refill:  Capillary refill takes less than 2 seconds.  Neurological:     General: No focal deficit present.     Mental Status: He is alert. Mental status is at baseline.     Motor: No weakness.     Gait: Gait normal.  Psychiatric:        Mood and Affect: Mood normal.        Behavior: Behavior normal.      UC Treatments / Results  Labs (all labs ordered are listed, but only abnormal results are displayed) Labs Reviewed - No data to display  EKG   Radiology No results found.  Procedures Procedures (including critical care time)  Medications Ordered in UC Medications - No data to display  Initial Impression / Assessment and Plan / UC Course  I have reviewed the triage vital signs and the nursing notes.  Pertinent labs & imaging results that were available during my care of the patient were reviewed by me and considered in my medical decision making (see chart for details).   58 year old male presents for onset of fatigue, loss of appetite, nausea/vomiting and diarrhea this morning.  No associated fever, abdominal pain, URI symptoms or urinary symptoms.  Has been around someone with similar symptoms.  Has taken Pepto-Bismol and ginger ale.  Vitals are normal and stable and he is overall well-appearing.  No acute distress.  Normal HEENT exam.  Chest clear auscultation heart regular rate and rhythm.  Abdomen soft with no tenderness, guarding or rebound.  Explained to patient that his symptoms are consistent with viral gastroenteritis.  Supportive care is encouraged with increasing rest and fluids.  Sent Zofran to pharmacy.  May continue Pepto, Tylenol, bland diet.  Thoroughly reviewed return and ER precautions.  Work note given.   Final Clinical Impressions(s) / UC Diagnoses   Final diagnoses:  Viral gastroenteritis  Nausea vomiting and diarrhea     Discharge Instructions      You may take Tylenol for pain relief. Use medications as directed including antiemetics and  antidiarrheal medications if suggested or prescribed. You should increase fluids and electrolytes as well as rest over these next several days. If you have any questions or concerns, or if your symptoms are not improving or if especially if they acutely worsen, please call or stop back to the clinic immediately and we will be happy to help you or go to the  ER   ABDOMINAL PAIN RED FLAGS: Seek immediate further care if: symptoms remain the same or worsen over the next 3-7 days, you are unable to keep fluids down, you see blood or mucus in your stool, you vomit black or dark red material, you have a fever of 101.F or higher, you have localized and/or persistent abdominal pain       ED Prescriptions     Medication Sig Dispense Auth. Provider   ondansetron (ZOFRAN-ODT) 4 MG disintegrating tablet Take 1 tablet (4 mg total) by mouth every 6 (six) hours as needed. 15 tablet Gareth Morgan      PDMP not reviewed this encounter.   Shirlee Latch, PA-C 07/05/23 [phone]
